# Patient Record
Sex: Male | Born: 1970 | Race: White | Hispanic: No | Marital: Married | Smoking: Never smoker
Health system: Southern US, Community
[De-identification: ages and names within clinical notes are randomized; demographics above are authoritative.]

## PROBLEM LIST (undated history)

## (undated) DIAGNOSIS — R569 Unspecified convulsions: Secondary | ICD-10-CM

## (undated) DIAGNOSIS — G510 Bell's palsy: Secondary | ICD-10-CM

## (undated) DIAGNOSIS — E782 Mixed hyperlipidemia: Secondary | ICD-10-CM

## (undated) HISTORY — DX: Mixed hyperlipidemia: E78.2

## (undated) HISTORY — PX: WRIST SURGERY: SHX841

---

## 2010-03-19 ENCOUNTER — Emergency Department (HOSPITAL_BASED_OUTPATIENT_CLINIC_OR_DEPARTMENT_OTHER): Admission: EM | Admit: 2010-03-19 | Discharge: 2010-03-19 | Payer: Self-pay | Admitting: Emergency Medicine

## 2010-03-19 ENCOUNTER — Ambulatory Visit: Payer: Self-pay | Admitting: Diagnostic Radiology

## 2012-05-16 ENCOUNTER — Ambulatory Visit (INDEPENDENT_AMBULATORY_CARE_PROVIDER_SITE_OTHER): Payer: BC Managed Care – PPO | Admitting: Family Medicine

## 2012-05-16 ENCOUNTER — Ambulatory Visit (HOSPITAL_BASED_OUTPATIENT_CLINIC_OR_DEPARTMENT_OTHER)
Admission: RE | Admit: 2012-05-16 | Discharge: 2012-05-16 | Disposition: A | Discharge status: Home / Self Care | Payer: BC Managed Care – PPO | Source: Ambulatory Visit | Attending: Family Medicine | Admitting: Family Medicine

## 2012-05-16 VITALS — BP 123/76 | Ht 77.0 in | Wt 245.0 lb

## 2012-05-16 DIAGNOSIS — M545 Low back pain, unspecified: Secondary | ICD-10-CM

## 2012-05-16 MED ORDER — PREDNISONE (PAK) 10 MG PO TABS
ORAL_TABLET | ORAL | Status: DC
Start: 2012-05-16 — End: 2015-10-06

## 2012-05-16 MED ORDER — MELOXICAM 15 MG PO TABS: 15.0000 mg | ORAL_TABLET | Freq: Every day | ORAL | Status: DC

## 2012-05-16 NOTE — Patient Instructions (Addendum)
You have a severe lumbar strain. Get x-rays downstairs before you leave today - we'll call you with the results. Prednisone dose pack x 6 days as directed. AFTER you finish prednisone start meloxicam 15mg  daily with food for pain and inflammation. Norco/vicodin as needed for severe pain (no driving on this medicine). Flexeril as needed for muscle spasms (no driving on this medicine if it makes you sleepy). Stay as active as possible. Do home exercises and stretches as directed - hold each for 20-30 seconds and do each one three times. Consider massage, chiropractor, physical therapy, and/or acupuncture. Physical therapy has been shown to be helpful while the others have mixed results. Strengthening of low back muscles, abdominal musculature are key for long term pain relief. If not improving, will consider further imaging (MRI). Follow up with me in 2 weeks if not better - if you want to do physical therapy after 2 weeks because you are improving just call me and we'll put this order in. Otherwise follow up in 1 month.

## 2012-05-19 ENCOUNTER — Encounter: Payer: Self-pay | Admitting: Family Medicine

## 2012-05-19 DIAGNOSIS — M545 Low back pain, unspecified: Secondary | ICD-10-CM | POA: Insufficient documentation

## 2012-05-19 NOTE — Assessment & Plan Note (Signed)
most likely due to lumbar strain, less likely disc herniation.  Radiographs negative for fracture or other bony abnormalities.  Prednisone dose pack then transition to meloxicam daily.  Norco as needed for severe pain (#60) q6h.  Shown home exercises and stretches.  F/u in 2 weeks if not better - would consider lumbar MRI at that time.  See instructions for further.

## 2012-05-19 NOTE — Progress Notes (Signed)
  Subjective:    Patient ID: Jay Cook, male    DOB: [birth year], 41 y.o.   MRN: [id number]  PCP: Dr. Corliss Blacker  HPI 41 yo M here for low back pain.  Patient states that yesterday while playing basketball he went to jump then prevented self from doing this. States this felt like it caused his upper half of body to go up then come down and compress hard. Had to stop playing as he developed fairly severe left sided low back pain as a result. No prior back problems. No leg symptoms. No numbness/tingling. No bowel/bladder dysfunction. Tried flexeril without much help.  History reviewed. No pertinent past medical history.  No current outpatient prescriptions on file prior to visit.    History reviewed. No pertinent past surgical history.  No Known Allergies  History   Social History  . Marital Status: Married    Spouse Name: N/A    Number of Children: N/A  . Years of Education: N/A   Occupational History  . Not on file.   Social History Main Topics  . Smoking status: Never Smoker   . Smokeless tobacco: Not on file  . Alcohol Use: Not on file  . Drug Use: Not on file  . Sexually Active: Not on file   Other Topics Concern  . Not on file   Social History Narrative  . No narrative on file    Family History  Problem Relation Age of Onset  . Hyperlipidemia Father   . Diabetes Neg Hx   . Heart attack Neg Hx   . Hypertension Neg Hx   . Sudden death Neg Hx     BP 123/76  Ht 6\' 5"  (1.956 m)  Wt 245 lb (111.131 kg)  BMI 29.05 kg/m2  Review of Systems See HPI above.    Objective:   Physical Exam Gen: NAD  Back: No gross deformity, scoliosis. TTP left lumbar paraspinal muscles.  No midline or bony TTP.  No right sided tenderness. FROM with pain on flexion > extension. Strength LEs 5/5 all muscle groups.   2+ MSRs in patellar and achilles tendons, equal bilaterally. Negative SLRs. Sensation intact to light touch bilaterally. Negative logroll bilateral hips    Assessment & Plan:  1. Low back pain - most likely due to lumbar strain, less likely disc herniation.  Radiographs negative for fracture or other bony abnormalities.  Prednisone dose pack then transition to meloxicam daily.  Norco as needed for severe pain (#60) q6h.  Shown home exercises and stretches.  F/u in 2 weeks if not better - would consider lumbar MRI at that time.  See instructions for further.

## 2014-06-16 ENCOUNTER — Ambulatory Visit (INDEPENDENT_AMBULATORY_CARE_PROVIDER_SITE_OTHER): Payer: BC Managed Care – PPO | Admitting: Sports Medicine

## 2014-06-16 ENCOUNTER — Encounter: Payer: Self-pay | Admitting: Sports Medicine

## 2014-06-16 VITALS — BP 113/61 | Ht 77.0 in | Wt 245.0 lb

## 2014-06-16 DIAGNOSIS — M7711 Lateral epicondylitis, right elbow: Secondary | ICD-10-CM | POA: Diagnosis not present

## 2014-06-16 MED ORDER — NITROGLYCERIN 0.2 MG/HR TD PT24: MEDICATED_PATCH | TRANSDERMAL | Status: DC

## 2014-06-16 NOTE — Patient Instructions (Signed)

## 2014-06-17 NOTE — Progress Notes (Signed)
   Subjective:    Patient ID: Jay Cook, male    DOB: 1970/09/01, 43 y.o.   MRN: [id number]  HPI chief complaint: Right elbow pain  Right-hand-dominant 43 year old male comes in today complaining of 4 months of right elbow pain. Pain began acutely while carrying a large plastic sleeve of water bottles. He describes a burning and aching discomfort along the lateral elbow which is present with grip and with twisting. He has tried a body helix compression sleeve with some improvement. Pain at times will radiate into his forearm and into the dorsum of his hand. No associated numbness or tingling. He has noticed some mild swelling as well. Denies similar problems in the past. No prior elbow surgeries. No neck pain. He's tried   intermittent doses of over-the-counter ibuprofen.  Past medical history reviewed Medications reviewed Allergies reviewed Social history reviewed    Review of Systems     Objective:   Physical Exam Well-developed, well-nourished. No acute distress. Awake alert and oriented 3. Vital signs reviewed  Right elbow: Full range of motion. No effusion. There is slight soft tissue swelling over the lateral epicondyle and tenderness to palpation here as well. Reproducible pain with resisted ECRB testing. No tenderness to palpation over the medial epicondyle. Negative Tinel's at the cubital tunnel. Decreased grip strength secondary to pain. Good radial and ulnar pulses.  MSK ultrasound of the right elbow: Limited images of the lateral elbow were obtained. There is a small hypoechoic area in the mid substance of the common extensor tendon. No obvious spur formation. No joint effusion. Findings are consistent with a possible small intrasubstance tear of the common extensor tendon.       Assessment & Plan:  Right elbow pain secondary to lateral epicondylitis  I discussed treatment options including cortisone injection versus a trial of topical nitroglycerin. We will start the  nitroglycerin protocol and I will couple this with eccentric exercises and stretching. Patient will continue with his body helix compression sleeve. He can alternatively try a counterforce brace. Follow-up with me in 4 weeks. If no improvement with the initial trial of nitroglycerin we will reconsider  merits of a cortisone injection. Patient will call with questions or concerns in the interim.

## 2014-07-19 ENCOUNTER — Ambulatory Visit (INDEPENDENT_AMBULATORY_CARE_PROVIDER_SITE_OTHER): Payer: BLUE CROSS/BLUE SHIELD | Admitting: Sports Medicine

## 2014-07-19 ENCOUNTER — Encounter: Payer: Self-pay | Admitting: Sports Medicine

## 2014-07-19 VITALS — BP 124/77 | HR 85 | Ht 77.0 in | Wt 255.0 lb

## 2014-07-19 DIAGNOSIS — M7711 Lateral epicondylitis, right elbow: Secondary | ICD-10-CM | POA: Diagnosis not present

## 2014-07-19 NOTE — Progress Notes (Signed)
   Subjective:    Patient ID: Jay Cook, male    DOB: 1970/09/29, 44 y.o.   MRN: [id number]  HPI  Patient comes in today for follow-up on right elbow lateral epicondylitis. He is feeling much better. States that he is about 70-75% better. He is using his nitroglycerin patches and doing his home exercises. He has no restrictions on activity.    Review of Systems     Objective:   Physical Exam Well-developed, well-nourished. No acute distress.  Right elbow: Full range of motion. No effusion. Previous soft tissue swelling has resolved. Minimal tenderness to palpation over the lateral epicondyle. Minimal pain with resisted ECRB testing. Good grip strength. Neurovascular intact distally.  MSK ultrasound of the right elbow was performed. A long image of the common extensor tendon at the insertion onto the lateral epicondyle was performed. Slight hypoechoic area deep to the tendon but the majority of the tendon appears normal with no signs of tearing.       Assessment & Plan:  Improving right elbow pain secondary to lateral epicondylitis  I think the patient should continue on his nitroglycerin patch for an additional 4 weeks. Continue eccentric exercises as well. As long as he continues to improve he can follow-up as needed.

## 2014-12-02 ENCOUNTER — Other Ambulatory Visit: Payer: Self-pay | Admitting: *Deleted

## 2014-12-02 MED ORDER — NITROGLYCERIN 0.2 MG/HR TD PT24
MEDICATED_PATCH | TRANSDERMAL | Status: DC
Start: 2014-12-02 — End: 2017-06-06

## 2015-04-06 ENCOUNTER — Other Ambulatory Visit: Payer: Self-pay | Admitting: *Deleted

## 2015-04-06 MED ORDER — MELOXICAM 15 MG PO TABS: ORAL_TABLET | ORAL | Status: DC

## 2015-08-01 ENCOUNTER — Other Ambulatory Visit: Payer: Self-pay | Admitting: *Deleted

## 2015-08-01 MED ORDER — MELOXICAM 15 MG PO TABS: ORAL_TABLET | ORAL | Status: DC

## 2015-10-06 ENCOUNTER — Encounter: Payer: Self-pay | Admitting: *Deleted

## 2015-10-06 ENCOUNTER — Encounter: Payer: Self-pay | Admitting: Family Medicine

## 2015-10-06 ENCOUNTER — Ambulatory Visit (INDEPENDENT_AMBULATORY_CARE_PROVIDER_SITE_OTHER): Payer: 59 | Admitting: Family Medicine

## 2015-10-06 VITALS — Ht 77.0 in | Wt 257.2 lb

## 2015-10-06 DIAGNOSIS — R635 Abnormal weight gain: Secondary | ICD-10-CM

## 2015-10-06 DIAGNOSIS — E669 Obesity, unspecified: Secondary | ICD-10-CM | POA: Diagnosis not present

## 2015-10-06 NOTE — Progress Notes (Signed)
Patient ID: Gad Aymond, male   DOB: May 25, 1971, 45 y.o.   MRN: [id number] Pre-determination form faxed today to [employer] [phone]) to request coverage for either 97802/3 (MNT) or 99401/2/3/4 (nutr counseling).  Sent in progress note from 10-06-15 and current height, weight, and BMI.

## 2015-10-06 NOTE — Progress Notes (Signed)
Medical Nutrition Therapy:  Appt start time: 1100 end time:  1200.  Assessment:  Primary concerns today: Weight management.   Learning Readiness: Ready  Jay Cook would like to better manage his weight.  He was referred by Dr. Micheline Chapman, whom he has seen for previous sports-related injuries.  Weight loss will not only reduce his injury risk, but will benefit his long-term health.  He has been working out consistently for the past year, and tries to make good food choices, but is not sure of the best way to eat for both health and weight loss.  Current BMI is 30.49; weight has increased ~12 lb since 2015.    Usual eating pattern includes 3 meals and 1 snack per day. Frequent foods and beverages include [employer] drink (3-4 X wk), eggs/egg whites, bagel, and [place] bacon for breakfast; deli salad for lunch most days, cereal (bedtime snack) with 1% or skim milk, 150 oz water/day.  Avoided foods include tomatoes, avocados, mushrooms, cucumbers.   Usual physical activity includes 60-90 min wt training 5-7 X wk, 30-60 min cardio (TM or elliptical) 2 X wk.   Also plays softball 2 X wk.  Bright said he sometimes gets light-headed during workouts, which he usually does after work.  Food intake tends to be heavier in evenings, and light-headedness during workouts suggests he would benefit by an afternoon snack prior to working out.  Has 2-YO son and 5-YO daughter with whom he lives, along with his wife.  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   (Eula's wife recently did the Next 34 Days program for wt mgmt.)  24-hr recall suggests intake of ~2790 kcal: (Up at 5:53 AM) B (6:15 AM)-   3 eggs (minus 1 yolk), 2 pcs [place] bacon, 1 bl'berry bagel, water 620 Snk ( AM)-   1 Builder Bar (20 g protein, 8 g sugar)    270 L (12 PM)-  Fajita nachos (chs, chx), water     500 Snk ( PM)-  --- D (6:45  PM)-  3 1/2 c chx, rice, broccoli, chs casserole, water   800(+) Snk (9:30)-  3 c Str'ber Sp K, 1 T xylitol, 2 c 1%  milk    500 Typical day? Yes.    Progress Towards Goal(s):  In progress.   Nutritional Diagnosis:  NI-1.5 Excessive energy intake As related to meal composition.  As evidenced by inadequate proportion of vegetables at meals (and BMI >30).    Intervention:  Nutrition education.  Handouts given during visit include:  AVS  Demonstrated degree of understanding via:  Teach Back  Barriers to learning/adherence to lifestyle change: Food has typically not been much of an interest/priority for Que; this will need to change in order to make meaningful changes in his intake.    Monitoring/Evaluation:  Dietary intake, exercise, and body weight prn; insurance coverage will influence his ability to follow up with subsequent appt(s).

## 2015-10-06 NOTE — Patient Instructions (Addendum)
-   Wt Loss:  Let's think in terms of 5-lb increments.    Recommendations: - Whole, real foods are best.  (If you cannot make this in your kitchen, it's highly processed.) - Eat at least 3 REAL meals and 1-2 snacks per day.  Aim for no more than 5 hours between eating.  Eat breakfast within one hour of getting up.   - Snacks:  Fruit, nuts, seeds, trail mix, yogurt, string cheese, cereal (look for at least 5 grams fiber per serving) with milk.    - Advance planning will be key to making this work.   - Workouts:  Be sure to have some fuel pre-workout as well as within 30 min post-ex.    - Include BOTH carb's and protein.    - Whey is the gold standard, but you may get the best absorption is possible with any kind of protein that is hydrolyzed.   - Obtain twice as many veg's as protein or carbohydrate foods for both lunch and dinner. - Pay attn to how you feel in your workouts and thru the day.  If you feel fatigued, that is a sign you're not getting adequate energy/carb's.    - Pro-stat sports drink:  1 oz (1 pkt) Pro-stat with 10 oz WF 365 lemonade; dilute to 20-24 oz with water.

## 2015-10-06 NOTE — Progress Notes (Signed)
Patient ID: Jay Cook, male   DOB: 26-Jul-1970, 45 y.o.   MRN: [id number] Pt called to discuss wanting to loss wt and to lower his BMI as a preventive care measure. Told pt we could refer him to Dr Jenne Campus at the [facility] and he could discuss nutritional counseling to manage his increased weight gain and preventive care options due to with her per Dr. Micheline Chapman

## 2015-10-28 ENCOUNTER — Ambulatory Visit (INDEPENDENT_AMBULATORY_CARE_PROVIDER_SITE_OTHER): Payer: 59 | Admitting: Family Medicine

## 2015-10-28 ENCOUNTER — Encounter: Payer: Self-pay | Admitting: Family Medicine

## 2015-10-28 VITALS — BP 128/78 | Ht 72.0 in | Wt 253.0 lb

## 2015-10-28 DIAGNOSIS — S76312A Strain of muscle, fascia and tendon of the posterior muscle group at thigh level, left thigh, initial encounter: Secondary | ICD-10-CM

## 2015-10-28 MED ORDER — TRAMADOL HCL 50 MG PO TABS: ORAL_TABLET | ORAL | Status: DC

## 2015-10-28 NOTE — Progress Notes (Signed)
Patient ID: Jay Cook, male   DOB: 1970-08-21, 45 y.o.   MRN: [id number]  Jay Cook - 45 y.o. male MRN [id number]  Date of birth: 1970-10-27    SUBJECTIVE:     Chief Complaint: Left posterior thigh pain, acute Date of injury 10/27/2015  HPI:Was running to first base when he felt a pop in the posterior part of his thigh. Had immediate onset of pain. This occurred last night.. Since then he has pain every time he tries to bear weight. Has had some swelling and bruising. Some pain at rest. ROS:     See history of present illness above. Additional pertinent review of systems is negative for fever, sweats, chills, unusual weight change. He's had no other arthralgias or myalgias. No calf pain. No ankle swelling. No knee stiffness or knee pain.  PERTINENT  PMH / PSH FH / / SH:  Past Medical, Surgical, Social, and Family History Reviewed & Updated in the EMR.  Pertinent findings include:  No personal history diabetes mellitus Nonsmoker No prior history of thigh injury  OBJECTIVE: BP 128/78 mmHg  Ht 6' (1.829 m)  Wt 253 lb (114.76 kg)  BMI 34.31 kg/m2  Physical Exam:  Vital signs are reviewed. GEN.: Well-developed male no acute distress THIGH/hamstring: Left. Small amount of ecchymoses about 8 inches from the she'll tuberosity and this is the area where he is tender to palpation. There is no defect noted. Hamstring strength is normal in my estimation although he is limited somewhat by pain. GAIT: Antalgic KNEES: No effusion. Full range of motion. Ligaments intact. HIPS: Internal/external rotation are full and painless. Flexion bilaterally 5 out of 5 symmetrical and painless.   ULTRASOUND: The origin of the hamstrings is intact without any sign of defect, no surrounding fluid. The biceps for Morris portion of the left hamstring is tender to palpation about 10 cm from the origin. This is where the bruising is located. Ultrasound here reveals some disruption of the muscle fibers. There is  increased Doppler activity noted here as well. The left semi-tendinosis has some very small amount of muscle fiber disruption but no increased Doppler activity.  ASSESSMENT & PLAN:  See problem based charting & AVS for pt instructions.

## 2015-10-28 NOTE — Assessment & Plan Note (Signed)
Compression, ice at least twice a day. Weightbearing as tolerated. No stretching or lower extremity exercise other than that used for ADLs. We will do this for a week and then see him back. He's doing well we can start him in phase I of rehabilitation. He is having quite a bit of pain so give him some tramadol. He works in [employer] and alternate sitting and standing, says he does not need a note for work.  Also cautioned him on signs of DVT since he has had a lower extremity injury. I would have him avoid NSAIDs for the next week and use the tramadol for pain.

## 2015-10-28 NOTE — Patient Instructions (Signed)
You have a small tear in your hamstring. Wear the compression sleeve. ICE twice daily for 15-20 minutes. Do not do any strenuous activity with the legs. See [place] next week. Please call us if there are any questions or  Problems.

## 2015-11-04 ENCOUNTER — Ambulatory Visit: Payer: 59 | Admitting: Family Medicine

## 2016-02-02 ENCOUNTER — Other Ambulatory Visit: Payer: Self-pay | Admitting: Otolaryngology

## 2016-02-02 DIAGNOSIS — J310 Chronic rhinitis: Secondary | ICD-10-CM

## 2016-02-02 DIAGNOSIS — J342 Deviated nasal septum: Secondary | ICD-10-CM

## 2016-02-20 ENCOUNTER — Other Ambulatory Visit: Payer: 59

## 2016-02-28 ENCOUNTER — Other Ambulatory Visit: Payer: 59

## 2016-04-23 ENCOUNTER — Encounter: Payer: Self-pay | Admitting: Sports Medicine

## 2016-04-23 ENCOUNTER — Ambulatory Visit (INDEPENDENT_AMBULATORY_CARE_PROVIDER_SITE_OTHER): Payer: 59 | Admitting: Sports Medicine

## 2016-04-23 VITALS — BP 144/81 | Ht 77.0 in | Wt 250.0 lb

## 2016-04-23 DIAGNOSIS — S39011A Strain of muscle, fascia and tendon of abdomen, initial encounter: Secondary | ICD-10-CM | POA: Diagnosis not present

## 2016-04-23 MED ORDER — DICLOFENAC SODIUM 75 MG PO TBEC: DELAYED_RELEASE_TABLET | ORAL | 1 refills | Status: DC

## 2016-04-23 NOTE — Progress Notes (Signed)
   Subjective:    Patient ID: Jay Cook, male    DOB: [birth year], 45 y.o.   MRN: [id number]  HPI chief complaint: Left groin pain  Jay Cook comes in today complaining of one day of left groin pain. He first noticed pain last night when rolling over in bed. He had a recurrence of pain earlier today. He describes it as sharp in quality. It is hard to reproduce. He denies similar issues in the past. He is worried about a possible hernia, specifically a sports hernia.  Interim medical history reviewed Medications reviewed Allergies reviewed    Review of Systems As above    Objective:   Physical Exam  Well-developed, well-nourished. No acute distress.  Left abdomen: There is no tenderness to palpation along the left lower abdomen at the insertion of the abdominal muscles onto the pubic bone. No palpable defect to suggest hernia. There is no reproducible pain with resisted sitting up. No pain with resisted adduction. His hip demonstrates smooth painless hip range of motion with a negative logroll. Normal testicle.      Assessment & Plan:   Left lower abdomen/groin pain likely secondary to simple muscle strain  Reassurance. Voltaren 75 mg twice daily for 5 days then when necessary. Resume activity as tolerated. If symptoms persist or worsen consider imaging likely in the form of an MRI. Follow-up as needed.

## 2016-06-07 ENCOUNTER — Other Ambulatory Visit: Payer: Self-pay | Admitting: Otolaryngology

## 2016-06-07 DIAGNOSIS — J342 Deviated nasal septum: Secondary | ICD-10-CM

## 2016-06-07 DIAGNOSIS — J329 Chronic sinusitis, unspecified: Secondary | ICD-10-CM

## 2016-06-08 ENCOUNTER — Ambulatory Visit
Admission: RE | Admit: 2016-06-08 | Discharge: 2016-06-08 | Disposition: A | Discharge status: Home / Self Care | Payer: 59 | Source: Ambulatory Visit | Attending: Otolaryngology | Admitting: Otolaryngology

## 2016-06-08 DIAGNOSIS — J329 Chronic sinusitis, unspecified: Secondary | ICD-10-CM

## 2016-06-08 DIAGNOSIS — J342 Deviated nasal septum: Secondary | ICD-10-CM

## 2016-09-13 DIAGNOSIS — J342 Deviated nasal septum: Secondary | ICD-10-CM | POA: Diagnosis not present

## 2016-09-13 DIAGNOSIS — J343 Hypertrophy of nasal turbinates: Secondary | ICD-10-CM | POA: Diagnosis not present

## 2016-09-27 DIAGNOSIS — J343 Hypertrophy of nasal turbinates: Secondary | ICD-10-CM | POA: Diagnosis not present

## 2016-09-27 DIAGNOSIS — J342 Deviated nasal septum: Secondary | ICD-10-CM | POA: Diagnosis not present

## 2016-10-18 DIAGNOSIS — H6591 Unspecified nonsuppurative otitis media, right ear: Secondary | ICD-10-CM | POA: Diagnosis not present

## 2016-10-18 DIAGNOSIS — H9201 Otalgia, right ear: Secondary | ICD-10-CM | POA: Diagnosis not present

## 2016-11-14 ENCOUNTER — Encounter: Payer: Self-pay | Admitting: Family Medicine

## 2016-11-14 ENCOUNTER — Other Ambulatory Visit: Payer: Self-pay | Admitting: *Deleted

## 2016-11-14 ENCOUNTER — Ambulatory Visit
Admission: RE | Admit: 2016-11-14 | Discharge: 2016-11-14 | Disposition: A | Discharge status: Home / Self Care | Payer: 59 | Source: Ambulatory Visit | Attending: Sports Medicine | Admitting: Sports Medicine

## 2016-11-14 ENCOUNTER — Ambulatory Visit (INDEPENDENT_AMBULATORY_CARE_PROVIDER_SITE_OTHER): Payer: 59 | Admitting: Family Medicine

## 2016-11-14 VITALS — BP 120/67 | Ht 77.0 in | Wt 260.0 lb

## 2016-11-14 DIAGNOSIS — M25571 Pain in right ankle and joints of right foot: Secondary | ICD-10-CM | POA: Diagnosis not present

## 2016-11-14 DIAGNOSIS — S86311A Strain of muscle(s) and tendon(s) of peroneal muscle group at lower leg level, right leg, initial encounter: Secondary | ICD-10-CM | POA: Diagnosis not present

## 2016-11-14 DIAGNOSIS — S93402A Sprain of unspecified ligament of left ankle, initial encounter: Secondary | ICD-10-CM | POA: Diagnosis not present

## 2016-11-14 MED ORDER — MELOXICAM 15 MG PO TABS: 15.0000 mg | ORAL_TABLET | Freq: Every day | ORAL | 1 refills | Status: DC

## 2016-11-14 NOTE — Progress Notes (Signed)
  Jay Cook - 46 y.o. male MRN [id number]  Date of birth: [birth year]  SUBJECTIVE:  Including CC & ROS.   Jay Cook is a 45 year old male that is presenting with right ankle pain. He is playing softball last night and was reaching to tag someone out and felt a pop in his ankle. He has had pain since that time swelling. He has tried some ice and compression. He denies any prior injury to his ankle. He denies any changes in sensation. He is having pain that is just distal to the lateral malleolus as well as some pain in the lateral aspect of his lower leg.  ROS: No unexpected weight loss, fever, chills, swelling, instability, numbness/tingling, redness, otherwise see HPI   HISTORY: Past Medical, Surgical, Social, and Family History Reviewed & Updated per EMR.   Pertinent Historical Findings include: Surgical:   Appendectomy Social:  No tobacco use  DATA REVIEWED: 11/14/16: Right ankle x-ray: Normal  PHYSICAL EXAM:  VS: BP 120/67   Ht 6' 5"  (1.956 m)   Wt 260 lb (117.9 kg)   BMI 30.83 kg/m  PHYSICAL EXAM: Gen: NAD, alert, cooperative with exam, well-appearing HEENT: clear conjunctiva, EOMI CV:  no edema, capillary refill brisk,  Resp: non-labored, normal speech Skin: no rashes, normal turgor  Neuro: no gross deficits.  Psych:  alert and oriented MSK: Right ankle: Some swelling on the lateral aspect of the ankle. Some tenderness to palpation over the peroneal muscles Normal plantar and dorsiflexion Good strength to resistance in all directions. Walking with a slight limp. Normal anterior drawer. Normal talar tilt. Thompson test with plantarflexion. Neurovascularly intact.  Limited ultrasound: Right leg/ankle:  Peroneal longus and peroneal brevis are still intact There appears to be fluid around the peroneals as a pass around the lateral malleolus. 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 He may have strained the tendon but also may have ruptured the retinaculum as there was some fluid surrounding the tendons around the lateral malleolus. There also seemed to be more subluxation with dynamic testing the peroneals around the lateral malleolus. - Body helix compression - Encouraged ice - Mobic for pain - Follow-up in 2 weeks to rescan - Can provide some home exercises at follow-up.

## 2016-11-15 DIAGNOSIS — S86311A Strain of muscle(s) and tendon(s) of peroneal muscle group at lower leg level, right leg, initial encounter: Secondary | ICD-10-CM | POA: Insufficient documentation

## 2016-11-15 NOTE — Assessment & Plan Note (Signed)
Does not appear that the tendons are ruptured as they were followed distally on ultrasound. He may have strained the tendon but also may have ruptured the retinaculum as there was some fluid surrounding the tendons around the lateral malleolus. There also seemed to be more subluxation with dynamic testing the peroneals around the lateral malleolus. - Body helix compression - Encouraged ice - Mobic for pain - Follow-up in 2 weeks to rescan - Can provide some home exercises at follow-up.

## 2017-04-01 DIAGNOSIS — J00 Acute nasopharyngitis [common cold]: Secondary | ICD-10-CM | POA: Diagnosis not present

## 2017-04-01 DIAGNOSIS — R5383 Other fatigue: Secondary | ICD-10-CM | POA: Diagnosis not present

## 2017-04-01 DIAGNOSIS — J069 Acute upper respiratory infection, unspecified: Secondary | ICD-10-CM | POA: Diagnosis not present

## 2017-04-01 DIAGNOSIS — R05 Cough: Secondary | ICD-10-CM | POA: Diagnosis not present

## 2017-04-19 DIAGNOSIS — E291 Testicular hypofunction: Secondary | ICD-10-CM | POA: Diagnosis not present

## 2017-04-20 DIAGNOSIS — Z23 Encounter for immunization: Secondary | ICD-10-CM | POA: Diagnosis not present

## 2017-06-06 ENCOUNTER — Ambulatory Visit: Payer: 59 | Admitting: Sports Medicine

## 2017-06-06 ENCOUNTER — Encounter: Payer: Self-pay | Admitting: Sports Medicine

## 2017-06-06 DIAGNOSIS — E291 Testicular hypofunction: Secondary | ICD-10-CM

## 2017-06-06 DIAGNOSIS — Z0000 Encounter for general adult medical examination without abnormal findings: Secondary | ICD-10-CM

## 2017-06-06 NOTE — Assessment & Plan Note (Addendum)
Doing testosterone injections, prescribed by urology. Labs were also checked by urology, we will remain hands-off. He has been doing the shots now for a couple of weeks and is not feeling any improvement, if testosterone levels are normal at the recheck we will need to look into sleep apnea as a cause for fatigue.

## 2017-06-06 NOTE — Assessment & Plan Note (Signed)
Up-to-date on tetanus and influenza vaccines. Physical normal. Checking routine labs.

## 2017-06-06 NOTE — Progress Notes (Signed)
  Subjective:    CC: Establish care.   HPI:  Denim is a pleasant 46 year old male, he has no complaints, here to establish care and get caught up on preventive measures.  He does do testosterone supplementation, this is managed by his urologist.  Past medical history:  Negative.  See flowsheet/record as well for more information.  Surgical history: Negative.  See flowsheet/record as well for more information.  Family history: Negative.  See flowsheet/record as well for more information.  Social history: Negative.  See flowsheet/record as well for more information.  Allergies, and medications have been entered into the medical record, reviewed, and no changes needed.    (To billers/coders, pertinent past medical, social, surgical, family history can be found in problem list, if problem list is marked as reviewed then this indicates that past medical, social, surgical, family history was also reviewed)  Review of Systems: No headache, visual changes, nausea, vomiting, diarrhea, constipation, dizziness, abdominal pain, skin rash, fevers, chills, night sweats, swollen lymph nodes, weight loss, chest pain, body aches, joint swelling, muscle aches, shortness of breath, mood changes, visual or auditory hallucinations.  Objective:    General: Well Developed, well nourished, and in no acute distress.  Neuro: Alert and oriented x3, extra-ocular muscles intact, sensation grossly intact. Cranial nerves II through XII are intact, motor, sensory, and coordinative functions are all intact. HEENT: Normocephalic, atraumatic, pupils equal round reactive to light, neck supple, no masses, no lymphadenopathy, thyroid nonpalpable. Oropharynx, nasopharynx, external ear canals are unremarkable. Skin: Warm and dry, no rashes noted.  Cardiac: Regular rate and rhythm, no murmurs rubs or gallops.  Respiratory: Clear to auscultation bilaterally. Not using accessory muscles, speaking in full sentences.  Abdominal: Soft,  nontender, nondistended, positive bowel sounds, no masses, no organomegaly.  Musculoskeletal: Shoulder, elbow, wrist, hip, knee, ankle stable, and with full range of motion.  Impression and Recommendations:    The patient was counselled, risk factors were discussed, anticipatory guidance given.  Annual physical exam Up-to-date on tetanus and influenza vaccines. Physical normal. Checking routine labs.  Male hypogonadism Doing testosterone injections, prescribed by urology. Labs were also checked by urology, we will remain hands-off. He has been doing the shots now for a couple of weeks and is not feeling any improvement, if testosterone levels are normal at the recheck we will need to look into sleep apnea as a cause for fatigue. ___________________________________________ Gwen Her. Dianah Field, M.D., ABFM., CAQSM. Primary Care and [place] In[employer] [place] [employer] [facility]

## 2017-06-14 DIAGNOSIS — L718 Other rosacea: Secondary | ICD-10-CM | POA: Diagnosis not present

## 2017-06-14 DIAGNOSIS — L853 Xerosis cutis: Secondary | ICD-10-CM | POA: Diagnosis not present

## 2017-06-17 DIAGNOSIS — E291 Testicular hypofunction: Secondary | ICD-10-CM | POA: Diagnosis not present

## 2017-06-27 DIAGNOSIS — Z0000 Encounter for general adult medical examination without abnormal findings: Secondary | ICD-10-CM | POA: Diagnosis not present

## 2017-06-28 ENCOUNTER — Encounter: Payer: Self-pay | Admitting: Physician Assistant

## 2017-06-28 ENCOUNTER — Other Ambulatory Visit: Payer: Self-pay

## 2017-06-28 DIAGNOSIS — E782 Mixed hyperlipidemia: Secondary | ICD-10-CM

## 2017-06-28 DIAGNOSIS — R7989 Other specified abnormal findings of blood chemistry: Secondary | ICD-10-CM

## 2017-06-28 DIAGNOSIS — R748 Abnormal levels of other serum enzymes: Secondary | ICD-10-CM

## 2017-06-28 HISTORY — DX: Mixed hyperlipidemia: E78.2

## 2017-06-28 LAB — CBC
HCT: 44.8 % (ref 38.5–50.0)
Hemoglobin: 15 g/dL (ref 13.2–17.1)
MCH: 27.9 pg (ref 27.0–33.0)
MCHC: 33.5 g/dL (ref 32.0–36.0)
MCV: 83.3 fL (ref 80.0–100.0)
MPV: 12.6 fL — ABNORMAL HIGH (ref 7.5–12.5)
Platelets: 189 Thousand/uL (ref 140–400)
RBC: 5.38 10*6/uL (ref 4.20–5.80)
RDW: 14.5 % (ref 11.0–15.0)
WBC: 4.7 Thousand/uL (ref 3.8–10.8)

## 2017-06-28 LAB — HIV ANTIBODY (ROUTINE TESTING W REFLEX): HIV 1&2 Ab, 4th Generation: NONREACTIVE

## 2017-06-28 LAB — LIPID PANEL W/REFLEX DIRECT LDL
Cholesterol: 215 mg/dL — ABNORMAL HIGH (ref <200)
HDL: 46 mg/dL (ref >40)
LDL Cholesterol (Calc): 145 mg/dL — ABNORMAL HIGH
Non-HDL Cholesterol (Calc): 169 mg/dL (calc) — ABNORMAL HIGH (ref <130)
Total CHOL/HDL Ratio: 4.7 (calc) (ref <5.0)
Triglycerides: 120 mg/dL (ref <150)

## 2017-06-28 LAB — COMPREHENSIVE METABOLIC PANEL
AG Ratio: 1.9 (calc) (ref 1.0–2.5)
AST: 35 U/L (ref 10–40)
Alkaline phosphatase (APISO): 41 U/L (ref 40–115)
CO2: 29 mmol/L (ref 20–32)
Chloride: 105 mmol/L (ref 98–110)
Globulin: 2.4 g/dL (calc) (ref 1.9–3.7)

## 2017-06-28 LAB — COMPREHENSIVE METABOLIC PANEL WITH GFR
ALT: 24 U/L (ref 9–46)
Albumin: 4.5 g/dL (ref 3.6–5.1)
BUN/Creatinine Ratio: 10 (calc) (ref 6–22)
BUN: 14 mg/dL (ref 7–25)
Calcium: 9.5 mg/dL (ref 8.6–10.3)
Creat: 1.46 mg/dL — ABNORMAL HIGH (ref 0.60–1.35)
Glucose, Bld: 112 mg/dL — ABNORMAL HIGH (ref 65–99)
Potassium: 4.7 mmol/L (ref 3.5–5.3)
Sodium: 139 mmol/L (ref 135–146)
Total Bilirubin: 0.6 mg/dL (ref 0.2–1.2)
Total Protein: 6.9 g/dL (ref 6.1–8.1)

## 2017-06-28 LAB — HEMOGLOBIN A1C
Hgb A1c MFr Bld: 5.5 % of total Hgb (ref <5.7)
Mean Plasma Glucose: 111 (calc)
eAG (mmol/L): 6.2 (calc)

## 2017-06-28 LAB — VITAMIN D 25 HYDROXY (VIT D DEFICIENCY, FRACTURES): Vit D, 25-Hydroxy: 26 ng/mL — ABNORMAL LOW (ref 30–100)

## 2017-06-28 LAB — TSH: TSH: 1.96 mIU/L (ref 0.40–4.50)

## 2017-06-28 NOTE — Progress Notes (Signed)
-   cholesterol is elevated. Work on decreasing saturated fat/processed food in diet, increasing aerobic exercise and weight loss - kidney function was a little reduced. Recommend he hydrate, avoid OTC pain relievers, and we recheck this in 1 week - vitamin D is a little low. Recommend daily vitamin D3 1000 unit supplement  Otherwise - normal blood counts - normal electrolytes and liver enzymes - normal thyroid function

## 2017-08-20 ENCOUNTER — Ambulatory Visit: Payer: 59 | Admitting: Sports Medicine

## 2017-08-20 DIAGNOSIS — B360 Pityriasis versicolor: Secondary | ICD-10-CM | POA: Diagnosis not present

## 2017-08-20 DIAGNOSIS — B029 Zoster without complications: Secondary | ICD-10-CM | POA: Diagnosis not present

## 2017-08-20 DIAGNOSIS — L718 Other rosacea: Secondary | ICD-10-CM | POA: Diagnosis not present

## 2017-09-09 DIAGNOSIS — J029 Acute pharyngitis, unspecified: Secondary | ICD-10-CM | POA: Diagnosis not present

## 2017-09-10 DIAGNOSIS — K120 Recurrent oral aphthae: Secondary | ICD-10-CM | POA: Diagnosis not present

## 2018-03-07 ENCOUNTER — Encounter: Payer: Self-pay | Admitting: Sports Medicine

## 2018-03-07 ENCOUNTER — Ambulatory Visit (INDEPENDENT_AMBULATORY_CARE_PROVIDER_SITE_OTHER): Payer: 59 | Admitting: Sports Medicine

## 2018-03-07 DIAGNOSIS — M25412 Effusion, left shoulder: Secondary | ICD-10-CM | POA: Insufficient documentation

## 2018-03-07 DIAGNOSIS — M25512 Pain in left shoulder: Secondary | ICD-10-CM

## 2018-03-07 MED ORDER — SULFAMETHOXAZOLE-TRIMETHOPRIM 800-160 MG PO TABS: 1.0000 | ORAL_TABLET | Freq: Two times a day (BID) | ORAL | 0 refills | Status: AC

## 2018-03-07 MED ORDER — TRAMADOL HCL 50 MG PO TABS: 50.0000 mg | ORAL_TABLET | Freq: Four times a day (QID) | ORAL | 0 refills | Status: DC | PRN

## 2018-03-07 MED FILL — SULFAMETHOXAZOLE-TMP DS TAB: 800-160 | 10 days supply | Qty: 20 | Fill #0

## 2018-03-07 MED FILL — traMADol HCL 50 MG TABS: 50 | 3 days supply | Qty: 10 | Fill #0

## 2018-03-07 NOTE — Assessment & Plan Note (Signed)
Acute onset x1 week, patient reports he self-injects testosterone into deltoid region.  Given ultrasound finding of fluid collection and history of fever, concern this may be 2/2 subcutaneous abscess requiring drainage by Orthopedics.  He is afebrile at visit today, however with significant warmth, swelling and tenderness over anterior deltoid region.  Do not suspect any involvement of the shoulder joint as his exam is otherwise within normal.   Have discussed with Dr. Griffin Basil at [facility].  Will trial po antibiotics at this time and schedule follow up at [facility] on Tuesday.  Attempted to drain under ultrasound guidance at this visit, however unable to extract any fluid from the collection so cx not sent.  -Rx: Bactrim DS BID x10 d  -may take Tylenol prn for fever; Rx: Tramadol 50 mg given for severe pain -Advised pt if he has any recurrent fevers on antibiotics or worsening of pain and swelling between now and his appointment, he should be seen in the ED. He expressed good understanding of this.

## 2018-03-07 NOTE — Progress Notes (Addendum)
Subjective:   Patient ID: Jay Cook    DOB: 28-Dec-1970, 47 y.o. male   MRN: [id number]  CC: left shoulder pain   HPI: Reginald Cook is a 47 y.o. male who presents to clinic today for shoulder pain x1 week.  He has a history of low testosterone for which he follows with urology and self-injects 0.5 cc testosterone into the deltoid region twice a week.  He reports he alternates shoulders each time and has been doing this for about 5 years without any adverse drug reaction.   His last injection was Saturday into the left deltoid.  On Tuesday he noticed worsening pain in the region and had a low-grade temp to 100F.  Fevers have not recurred since.  He lifts heavy weights, however had not lifted since Tuesday the week prior to injection.  No history of fall or trauma. Did not feel a popping sensation at any time while exercising previously.  He has found it difficult to find a comfortable position to sleep in and the pain often times keeps him awake at night.  Pain worse with driving and applying deodorant as well as dressing himself and other overhead activity.  He reports pain is 9/10 at worst, constant in nature and does not radiate down the arm or into his neck.  No numbness or tingling in hand or fingers.   He has noticed visible swelling over the shoulder joint compared to his right side.  He has tried Advil liquid gels without any significant relief.  Has also tried applying ice and heating pads which have not helped.   ROS: +fever and joint swelling.  No weakness, numbness, tingling.      [place]: Pertinent past medical, surgical, family, and social history were reviewed and updated as appropriate. Smoking status reviewed. Medications reviewed.  Objective:   BP 139/86   Ht 6' 6"  (1.981 m)   Wt 275 lb (124.7 kg)   BMI 31.78 kg/m  Temp: 98.66F  Vitals and nursing note reviewed.  General: well nourished, well developed, in no acute distress with non-toxic appearance   Left  Shoulder: Inspection reveals visible swelling over left anterior deltoid region.  Markedly tender to palpation over deltoid, no tenderness over AC joint or bicipital groove. ROM with limited abduction to 90 degrees due to pain and swelling.    Rotator cuff strength 5/5 throughout.  No signs of impingement with negative Neer and Hawkin's tests, empty can test. Speeds and Yergason's tests normal. No labral pathology noted with negative Obrien's.   Normal scapular function observed. No painful arc and no drop arm sign. No apprehension sign.   Neuro: alert, oriented x3, no focal deficits. Normal sensation, neurovascularly intact distally.    Ultrasound: Revealed 2.5-3 cm x1.5 cm subcutaneous collection of fluid, suspect this may be a loculated abscess given history. Surrounding soft tissue is very hyperechoic suggestive of concomitant cellulitis.   Assessment & Plan:   Pain and swelling of left shoulder Acute onset x1 week, patient reports he self-injects testosterone into deltoid region.  Given ultrasound finding of fluid collection and history of fever, concern this may be 2/2 subcutaneous abscess requiring drainage by Orthopedics.  He is afebrile at visit today, however with significant warmth, swelling and tenderness over anterior deltoid region.  Do not suspect any involvement of the shoulder joint as his exam is otherwise within normal.   Have discussed with Dr. Griffin Basil at [facility].  Will trial po antibiotics at this time and schedule follow up  at [facility] on Tuesday.  Attempted to drain under ultrasound guidance at this visit, however unable to extract any fluid from the collection so cx not sent.  -Rx: Bactrim DS BID x10 d  -may take Tylenol prn for fever; Rx: Tramadol 50 mg given for severe pain -Advised pt if he has any recurrent fevers on antibiotics or worsening of pain and swelling between now and his appointment, he should be seen in the ED. He expressed good understanding of this.      Meds ordered this encounter  Medications  . sulfamethoxazole-trimethoprim (BACTRIM DS) 800-160 MG tablet    Sig: Take 1 tablet by mouth 2 (two) times daily for 10 days.    Dispense:  20 tablet    Refill:  0  . traMADol (ULTRAM) 50 MG tablet    Sig: Take 1 tablet (50 mg total) by mouth every 6 (six) hours as needed for moderate pain or severe pain.    Dispense:  10 tablet    Refill:  0   Lovenia Kim, MD [place] PGY-3 03/07/2018 11:31 AM   Patient seen and evaluated with the resident. I agree with the above plan of care. Patient's physical exam and ultrasound findings are concerning for cellulitis with an underlying abscess. Unfortunately, aspiration of this area yielded no fluid so culture was unobtainable. Patient will start Bactrim DS twice daily for the next 10 days and we have scheduled an appointment with Dr. Griffin Basil early next week. Patient is advised to seek treatment in the emergency room if his pain continues, he starts to become ill, or starts to develop fever. Patient voiced understanding of this.

## 2018-03-07 NOTE — Patient Instructions (Signed)
[facility] Orthopedics [address]helia [address] Tuesday 03/11/18 [address] [place]

## 2018-03-11 DIAGNOSIS — M25512 Pain in left shoulder: Secondary | ICD-10-CM | POA: Diagnosis not present

## 2018-04-28 DIAGNOSIS — J189 Pneumonia, unspecified organism: Secondary | ICD-10-CM | POA: Diagnosis not present

## 2018-04-28 DIAGNOSIS — R05 Cough: Secondary | ICD-10-CM | POA: Diagnosis not present

## 2018-04-28 DIAGNOSIS — Z23 Encounter for immunization: Secondary | ICD-10-CM | POA: Diagnosis not present

## 2018-04-28 DIAGNOSIS — J019 Acute sinusitis, unspecified: Secondary | ICD-10-CM | POA: Diagnosis not present

## 2018-05-02 DIAGNOSIS — R569 Unspecified convulsions: Secondary | ICD-10-CM

## 2018-05-02 HISTORY — DX: Unspecified convulsions: R56.9

## 2018-05-04 ENCOUNTER — Emergency Department (HOSPITAL_COMMUNITY): Payer: 59

## 2018-05-04 ENCOUNTER — Emergency Department (HOSPITAL_COMMUNITY)
Admission: EM | Admit: 2018-05-04 | Discharge: 2018-05-04 | Disposition: A | Discharge status: Home / Self Care | Payer: 59 | Attending: Emergency Medicine | Admitting: Emergency Medicine

## 2018-05-04 ENCOUNTER — Encounter (HOSPITAL_COMMUNITY): Payer: Self-pay

## 2018-05-04 DIAGNOSIS — G40409 Other generalized epilepsy and epileptic syndromes, not intractable, without status epilepticus: Secondary | ICD-10-CM | POA: Diagnosis not present

## 2018-05-04 DIAGNOSIS — G40909 Epilepsy, unspecified, not intractable, without status epilepticus: Secondary | ICD-10-CM | POA: Insufficient documentation

## 2018-05-04 DIAGNOSIS — R569 Unspecified convulsions: Secondary | ICD-10-CM

## 2018-05-04 DIAGNOSIS — N189 Chronic kidney disease, unspecified: Secondary | ICD-10-CM | POA: Diagnosis not present

## 2018-05-04 DIAGNOSIS — J189 Pneumonia, unspecified organism: Secondary | ICD-10-CM | POA: Diagnosis not present

## 2018-05-04 DIAGNOSIS — N289 Disorder of kidney and ureter, unspecified: Secondary | ICD-10-CM | POA: Diagnosis not present

## 2018-05-04 DIAGNOSIS — R404 Transient alteration of awareness: Secondary | ICD-10-CM | POA: Diagnosis not present

## 2018-05-04 DIAGNOSIS — G40901 Epilepsy, unspecified, not intractable, with status epilepticus: Secondary | ICD-10-CM | POA: Diagnosis not present

## 2018-05-04 LAB — BASIC METABOLIC PANEL
ANION GAP: 11 (ref 5–15)
BUN: 17 mg/dL (ref 6–20)
CO2: 21 mmol/L — ABNORMAL LOW (ref 22–32)
Calcium: 9 mg/dL (ref 8.9–10.3)
Chloride: 104 mmol/L (ref 98–111)
Creatinine, Ser: 1.7 mg/dL — ABNORMAL HIGH (ref 0.61–1.24)
GFR calc Af Amer: 54 mL/min — ABNORMAL LOW (ref >60)
GFR, EST NON AFRICAN AMERICAN: 47 mL/min — AB (ref >60)
GLUCOSE: 129 mg/dL — AB (ref 70–99)
POTASSIUM: 3.8 mmol/L (ref 3.5–5.1)
Sodium: 136 mmol/L (ref 135–145)

## 2018-05-04 LAB — CBC
HCT: 47.5 % (ref 39.0–52.0)
HEMOGLOBIN: 15.7 g/dL (ref 13.0–17.0)
MCH: 28.3 pg (ref 26.0–34.0)
MCHC: 33.1 g/dL (ref 30.0–36.0)
MCV: 85.7 fL (ref 80.0–100.0)
Platelets: 213 10*3/uL (ref 150–400)
RBC: 5.54 MIL/uL (ref 4.22–5.81)
RDW: 12.3 % (ref 11.5–15.5)
WBC: 7.6 10*3/uL (ref 4.0–10.5)
nRBC: 0 % (ref 0.0–0.2)

## 2018-05-04 LAB — RAPID URINE DRUG SCREEN, HOSP PERFORMED
Amphetamines: NOT DETECTED
Barbiturates: NOT DETECTED
Benzodiazepines: NOT DETECTED
COCAINE: NOT DETECTED
OPIATES: NOT DETECTED
TETRAHYDROCANNABINOL: NOT DETECTED

## 2018-05-04 LAB — CBG MONITORING, ED: GLUCOSE-CAPILLARY: 133 mg/dL — AB (ref 70–99)

## 2018-05-04 NOTE — Discharge Instructions (Addendum)
Please follow-up with the neurology service listed above, there are 3 different phone numbers, you may follow-up with any of them.  You will need to have an EEG performed to make sure that there is no signs of a seizure focus in your brain.  At this time he do not need to be on daily medication to prevent seizures however you may need this if you have another seizure.  You are not to drive, you are not legally allowed to until you are cleared by a neurologist.  If you should have another seizure, please return to the emergency department.

## 2018-05-04 NOTE — ED Triage Notes (Signed)
Pt comes via [facility] from home, recent dx of PNA, finished antibiotics several days ago, had grand mal seizure tonight about 3-4 minutes, postictal, no hx of seizures. Pt diaphoretic with EMS and warm to touch. ETOH today, appx 6 beers.

## 2018-05-04 NOTE — ED Notes (Signed)
Placed on 2L [place]

## 2018-05-04 NOTE — ED Provider Notes (Signed)
[facility] EMERGENCY DEPARTMENT Provider Note   CSN: [id number] Arrival date & time: 05/04/18  0404     History   Chief Complaint Chief Complaint  Patient presents with  . Seizures    HPI Jay Cook is a 47 y.o. male.  HPI  The patient is a 47 year old male, he has a history of hyperlipidemia, he is unable to give any history as he is postictal after having a seizure.  According to his significant other who is at the bedside and who was present this evening when he had a seizure, she reports that he came out of the bathroom, sat down on the bed, laid down and almost immediately made a grunting sound with his mouth, both of his arms became stiff and they were held above the head, he started having shaking of his body, he continued to shake and fell off the bed.  This episode lasted less than 2 minutes, promptly resolved however he was very postictal, not breathing correctly, there is lots of grunting and frothing at the mouth until he ultimately stopped.  Paramedics arrived and found the patient to be diaphoretic confused and slightly combative.  There was no medications given prehospital.  He has not had another seizure since that time.  This occurred just prior to arrival.  According to the medical record the patient was recently prescribed tramadol, that was approximately a month and a half ago but it is not clear whether he has been taking it.  He has also been taking Flexeril.  He had recently finished an antibiotic which was thought to be Zithromax for pneumonia.  He had approximately 6 beers this evening, he was in his usual state of health until this occurred.  He has never had seizures in the past.  As the patient is still postictal on level 5 caveat applies as he is unable to answer questions consistently  Past Medical History:  Diagnosis Date  . Mixed hyperlipidemia 06/28/2017    Patient Active Problem List   Diagnosis Date Noted  . Pain and swelling of  left shoulder 03/07/2018  . Mixed hyperlipidemia 06/28/2017  . Annual physical exam 06/06/2017  . 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 WRIST SURGERY          Home Medications    Prior to Admission medications   Not on File    Family History Family History  Problem Relation Age of Onset  . Hyperlipidemia Father   . Diabetes Neg Hx   . Heart attack Neg Hx   . Hypertension Neg Hx   . Sudden death Neg Hx     Social History Social History   Tobacco Use  . Smoking status: Never Smoker  . Smokeless tobacco: Never Used  Substance Use Topics  . Alcohol use: Not on file  . Drug use: Not on file     Allergies   Patient has no known allergies.   Review of Systems Review of Systems  Unable to perform ROS: Mental status change     Physical Exam Updated Vital Signs BP (!) 116/40 (BP Location: Right Arm)   Pulse 85   Temp 97.7 F (36.5 C) (Oral)   Resp 17   SpO2 100%   Physical Exam  Constitutional: He appears well-developed and well-nourished. No distress.  HENT:  Head: Normocephalic and atraumatic.  Mouth/Throat: Oropharynx is clear and moist. No oropharyngeal exudate.  No bleeding of the tongue, no  injuries to the tongue or the teeth  Eyes: Pupils are equal, round, and reactive to light. Conjunctivae and EOM are normal. Right eye exhibits no discharge. Left eye exhibits no discharge. No scleral icterus.  Neck: Normal range of motion. Neck supple. No JVD present. No thyromegaly present.  Cardiovascular: Normal rate, regular rhythm, normal heart sounds and intact distal pulses. Exam reveals no gallop and no friction rub.  No murmur heard. Pulmonary/Chest: Effort normal and breath sounds normal. No respiratory distress. He has no wheezes. He has no rales.  Abdominal: Soft. Bowel sounds are normal. He exhibits no distension and no mass. There is no tenderness.  Musculoskeletal: Normal range of motion. He exhibits  no edema or tenderness.  Lymphadenopathy:    He has no cervical adenopathy.  Neurological: He is alert. Coordination normal.  The patient is able to move all 4 extremities, he has normal strength, his pupils are normal, cranial nerves III through XII are normal, he is somnolent but arousable to voice and is able to follow commands.  His memory is not intact, he has some repetitive statements  Skin: Skin is warm and dry. No rash noted. No erythema.  Psychiatric: He has a normal mood and affect. His behavior is normal.  Nursing note and vitals reviewed.    ED Treatments / Results  Labs (all labs ordered are listed, but only abnormal results are displayed) Labs Reviewed  BASIC METABOLIC PANEL - Abnormal; Notable for the following components:      Result Value   CO2 21 (*)    Glucose, Bld 129 (*)    Creatinine, Ser 1.70 (*)    GFR calc non Af Amer 47 (*)    GFR calc Af Amer 54 (*)    All other components within normal limits  CBG MONITORING, ED - Abnormal; Notable for the following components:   Glucose-Capillary 133 (*)    All other components within normal limits  CBC  RAPID URINE DRUG SCREEN, HOSP PERFORMED    EKG None  Radiology Ct Head Wo Contrast  Result Date: 05/04/2018 CLINICAL DATA:  New onset grand mal seizure. History of hyperlipidemia. EXAM: CT HEAD WITHOUT CONTRAST TECHNIQUE: Contiguous axial images were obtained from the base of the skull through the vertex without intravenous contrast. COMPARISON:  Sinus CT June 08, 2016 FINDINGS: BRAIN: No intraparenchymal hemorrhage, mass effect nor midline shift. The ventricles and sulci are normal. No acute large vascular territory infarcts. No abnormal extra-axial fluid collections. Basal cisterns are patent. VASCULAR: Unremarkable. SKULL/SOFT TISSUES: No skull fracture. No significant soft tissue swelling. ORBITS/SINUSES: The included ocular globes and orbital contents are normal.Severe paranasal sinusitis. Mastoid air cells  are well aerated. OTHER: None. IMPRESSION: 1. Normal CT HEAD without contrast. 2. Severe paranasal sinusitis. Electronically Signed   By: Elon Alas M.D.   On: 05/04/2018 05:02    Procedures Procedures (including critical care time)  Medications Ordered in ED Medications - No data to display   Initial Impression / Assessment and Plan / ED Course  I have reviewed the triage vital signs and the nursing notes.  Pertinent labs & imaging results that were available during my care of the patient were reviewed by me and considered in my medical decision making (see chart for details).  Clinical Course as of May 04 650  Nancy Fetter May 04, 2018  4696 CT scan reveals no findings of abnormal brain however it does show severe paranasal sinus disease.   [BM]  0615 The labs show that the  patient has a minimal hyperglycemia, the creatinine is 1.7 which is slightly higher than baseline.  CBC is normal.   [BM]    Clinical Course User Index [BM] Noemi Chapel, MD   The exact etiology of the patient's symptoms is unclear.  He had a witnessed seizure, he had taken a Flexeril just prior to this occurring, tramadol would lower the seizure threshold but he denies taking any of this recently.  Will obtain a CT scan of the brain to rule out mass lesions or other sources of possible seizure, seizure precautions will be initiated in the room, he is not currently having any tachycardia or diaphoresis and continues to improve.  CT scan negative, labs are unremarkable except for a progressive slight renal dysfunction of which the patient is now been made aware.  His blood work did not show any other findings including no hypoglycemia.  I discussed this with the significant other at the bedside, the patient is now more awake and alert and following commands.  He has been able to ambulate and tolerate fluids.  He is aware of seizure precautions including no driving.  He has been given the follow-up information for 3  different neurology offices.  Final Clinical Impressions(s) / ED Diagnoses   Final diagnoses:  Seizure ([place])  Renal insufficiency      Noemi Chapel, MD 05/04/18 [phone]

## 2018-05-04 NOTE — ED Notes (Signed)
CBG was 133. Nurse notified.

## 2018-05-05 ENCOUNTER — Encounter: Payer: Self-pay | Admitting: Sports Medicine

## 2018-05-05 ENCOUNTER — Telehealth: Payer: Self-pay | Admitting: Sports Medicine

## 2018-05-05 ENCOUNTER — Encounter: Payer: Self-pay | Admitting: Neurology

## 2018-05-05 ENCOUNTER — Ambulatory Visit (INDEPENDENT_AMBULATORY_CARE_PROVIDER_SITE_OTHER): Payer: 59 | Admitting: Sports Medicine

## 2018-05-05 DIAGNOSIS — R569 Unspecified convulsions: Secondary | ICD-10-CM

## 2018-05-05 MED ORDER — DIAZEPAM 2.5 MG RE GEL: 2.5000 mg | Freq: Once | RECTAL | 0 refills | Status: DC

## 2018-05-05 NOTE — Telephone Encounter (Signed)
Pt called clinic stating he was treated by the ED this weekend for a seizure. Requesting PCP place neurology referral for him. He was advised at the ED that if PCP places the referral he would be seen much faster.  Routing. Urgent referral pended.

## 2018-05-05 NOTE — Progress Notes (Signed)
Subjective:    CC: New onset seizure  HPI: This is a pleasant 47 year old male who was in his previous state of health.  On Saturday he drank several beers, 6-8.  Took a Flexeril as he had some back pain and then went to bed.  At around 3 AM his girlfriend noted his arms flung up in the air and he started shaking violently.  This lasted for about 20 seconds, and then stopped.  He did have some foaming at the mouth, eyes rolled back, he made some moaning noises, and bit his lips and tongue as well as became incontinent.  She called EMS, when they arrived he was taken to the emergency department.  He was postictal in the emergency department but moving all 4 extremities.  He was evaluated with a head CT, CBC, BMP and urine drug screen were normal.  He was discharged with outpatient follow-up.  Has not had any further seizures, but does have persistent numbness on the left side of his tongue.  He is not driving.  I reviewed the past medical history, family history, social history, surgical history, and allergies today and no changes were needed.  Please see the problem list section below in epic for further details.  Past Medical History: Past Medical History:  Diagnosis Date  . Mixed hyperlipidemia 06/28/2017   Past Surgical History: Past Surgical History:  Procedure Laterality Date  . WRIST SURGERY     Social History: Social History   Socioeconomic History  . Marital status: Married    Spouse name: Not on file  . Number of children: Not on file  . Years of education: Not on file  . Highest education level: Not on file  Occupational History  . Not on file  Social Needs  . Financial resource strain: Not on file  . Food insecurity:    Worry: Not on file    Inability: Not on file  . Transportation needs:    Medical: Not on file    Non-medical: Not on file  Tobacco Use  . Smoking status: Never Smoker  . Smokeless tobacco: Never Used  Substance and Sexual Activity  . Alcohol use:  Not on file  . Drug use: Not on file  . Sexual activity: Not on file  Lifestyle  . Physical activity:    Days per week: Not on file    Minutes per session: Not on file  . Stress: Not on file  Relationships  . Social connections:    Talks on phone: Not on file    Gets together: Not on file    Attends religious service: Not on file    Active member of club or organization: Not on file    Attends meetings of clubs or organizations: Not on file    Relationship status: Not on file  Other Topics Concern  . Not on file  Social History Narrative  . Not on file   Family History: Family History  Problem Relation Age of Onset  . Hyperlipidemia Father   . Diabetes Neg Hx   . Heart attack Neg Hx   . Hypertension Neg Hx   . Sudden death Neg Hx    Allergies: No Known Allergies Medications: See med rec.  Review of Systems: No fevers, chills, night sweats, weight loss, chest pain, or shortness of breath.   Objective:    General: Well Developed, well nourished, and in no acute distress.  Neuro: Alert and oriented x3, extra-ocular muscles intact, sensation grossly intact.  Cranial nerves II through XII are intact with the exception of left-sided tongue sensation. HEENT: Normocephalic, atraumatic, pupils equal round reactive to light, neck supple, no masses, no lymphadenopathy, thyroid nonpalpable.  Skin: Warm and dry, no rashes. Cardiac: Regular rate and rhythm, no murmurs rubs or gallops, no lower extremity edema.  Respiratory: Clear to auscultation bilaterally. Not using accessory muscles, speaking in full sentences.  Impression and Recommendations:    Seizure [facility]) New onset seizure Saturday night. He did drink 6 beers beforehand, and took a Flexeril. No drug use, negative UDS in the ED, negative CBC, BMP. He does need to see a neurologist, I am going to add a brain MRI with and without contrast. He was advised to avoid driving until further evaluation by neurology. Prescription  given for rectal diazepam for seizure abortion if occurs at home. ___________________________________________ Gwen Her. Dianah Field, M.D., ABFM., CAQSM. Primary Care and Sports Medicine [facility]  Adjunct Professor of [employer]

## 2018-05-05 NOTE — Assessment & Plan Note (Signed)
New onset seizure Saturday night. He did drink 6 beers beforehand, and took a Flexeril. No drug use, negative UDS in the ED, negative CBC, BMP. He does need to see a neurologist, I am going to add a brain MRI with and without contrast. He was advised to avoid driving until further evaluation by neurology. Prescription given for rectal diazepam for seizure abortion if occurs at home.

## 2018-05-05 NOTE — Patient Instructions (Signed)
Seizure, Adult A seizure is a sudden burst of abnormal electrical activity in the brain. The abnormal activity temporarily interrupts normal brain function, causing a person to experience any of the following:  Involuntary movements.  Changes in awareness or consciousness.  Uncontrollable shaking (convulsions).  Seizures usually last from 30 seconds to 2 minutes. They usually do not cause permanent brain damage unless they are prolonged. What can cause a seizure to happen? Seizures can happen for many reasons including:  A fever.  Low blood sugar.  A medicine.  An illnesses.  A brain injury.  Some people who have a seizure never have another one. People who have repeated seizures have a condition called epilepsy. What are the symptoms of a seizure? Symptoms of a seizure vary greatly from person to person. They include:  Convulsions.  Stiffening of the body.  Involuntary movements of the arms or legs.  Loss of consciousness.  Breathing problems.  Falling suddenly.  Confusion.  Head nodding.  Eye blinking or fluttering.  Lip smacking.  Drooling.  Rapid eye movements.  Grunting.  Loss of bladder control and bowel control.  Staring.  Unresponsiveness.  Some people have symptoms right before a seizure happens (aura) and right after a seizure happens. Symptoms of an aura include:  Fear or anxiety.  Nausea.  Feeling like the room is spinning (vertigo).  A feeling of having seen or heard something before (deja vu).  Odd tastes or smells.  Changes in vision, such as seeing flashing lights or spots.  Symptoms that may follow a seizure include:  Confusion.  Sleepiness.  Headache.  Weakness of one side of the body.  Follow these instructions at home: Medicines   Take over-the-counter and prescription medicines only as told by your health care provider.  Avoid any substances that may prevent your medicine from working properly, such as  alcohol. Activity  Do not drive, swim, or do any other activities that would be dangerous if you had another seizure. Wait until your health care provider approves.  If you live in the U[place] check with your local DMV (department of motor vehicles) to find out about the local driving laws. Each state has specific rules about when you can legally return to driving.  Get enough rest. Lack of sleep can make seizures more likely to occur. Educating others Teach friends and family what to do if you have a seizure. They should:  Lay you on the ground to prevent a fall.  Cushion your head and body.  Loosen any tight clothing around your neck.  Turn you on your side. If vomiting occurs, this helps keep your airway clear.  Stay with you until you recover.  Not hold you down. Holding you down will not stop the seizure.  Not put anything in your mouth.  Know whether or not you need emergency care.  General instructions  Contact your health care provider each time you have a seizure.  Avoid anything that has ever triggered a seizure for you.  Keep a seizure diary. Record what you remember about each seizure, especially anything that might have triggered the seizure.  Keep all follow-up visits as told by your health care provider. This is important. Contact a health care provider if:  You have another seizure.  You have seizures more often.  Your seizure symptoms change.  You continue to have seizures with treatment.  You have symptoms of an infection or illness. They might increase your risk of having a seizure. Get help  right away if:  You have a seizure: ? That lasts longer than 5 minutes. ? That is different than previous seizures. ? That leaves you unable to speak or use a part of your body. ? That makes it harder to breathe. ? After a head injury.  You have: ? Multiple seizures in a row. ? Confusion or a severe headache right after a seizure.  You are having  seizures more often.  You do not wake up immediately after a seizure.  You injure yourself during a seizure. These symptoms may represent a serious problem that is an emergency. Do not wait to see if the symptoms will go away. Get medical help right away. Call your local emergency services (911 in the [place] Do not drive yourself to the hospital. This information is not intended to replace advice given to you by your health care provider. Make sure you discuss any questions you have with your health care provider. Document Released: 06/15/2000 Document Revised: 02/12/2016 Document Reviewed: 01/20/2016 Elsevier Interactive Patient Education  [employer].

## 2018-05-05 NOTE — Assessment & Plan Note (Signed)
New seizure, seen in the ED, referral to neurology.

## 2018-05-06 ENCOUNTER — Ambulatory Visit (HOSPITAL_COMMUNITY)
Admission: RE | Admit: 2018-05-06 | Discharge: 2018-05-06 | Disposition: A | Discharge status: Home / Self Care | Payer: 59 | Source: Ambulatory Visit | Attending: Sports Medicine | Admitting: Sports Medicine

## 2018-05-06 DIAGNOSIS — R569 Unspecified convulsions: Secondary | ICD-10-CM | POA: Diagnosis present

## 2018-05-06 DIAGNOSIS — J329 Chronic sinusitis, unspecified: Secondary | ICD-10-CM | POA: Diagnosis not present

## 2018-05-06 MED ORDER — GADOBUTROL 1 MMOL/ML IV SOLN
10.0000 mL | Freq: Once | INTRAVENOUS | Status: AC | PRN
  Administered 2018-05-06: 10 mL via INTRAVENOUS

## 2018-05-07 DIAGNOSIS — E538 Deficiency of other specified B group vitamins: Secondary | ICD-10-CM | POA: Diagnosis not present

## 2018-05-07 DIAGNOSIS — E559 Vitamin D deficiency, unspecified: Secondary | ICD-10-CM | POA: Diagnosis not present

## 2018-05-07 DIAGNOSIS — E531 Pyridoxine deficiency: Secondary | ICD-10-CM | POA: Diagnosis not present

## 2018-05-07 DIAGNOSIS — R200 Anesthesia of skin: Secondary | ICD-10-CM | POA: Diagnosis not present

## 2018-05-07 DIAGNOSIS — G3184 Mild cognitive impairment, so stated: Secondary | ICD-10-CM | POA: Diagnosis not present

## 2018-06-03 ENCOUNTER — Encounter: Payer: Self-pay | Admitting: Sports Medicine

## 2018-06-03 ENCOUNTER — Ambulatory Visit (INDEPENDENT_AMBULATORY_CARE_PROVIDER_SITE_OTHER): Payer: 59

## 2018-06-03 ENCOUNTER — Ambulatory Visit (INDEPENDENT_AMBULATORY_CARE_PROVIDER_SITE_OTHER): Payer: 59 | Admitting: Sports Medicine

## 2018-06-03 DIAGNOSIS — M76892 Other specified enthesopathies of left lower limb, excluding foot: Secondary | ICD-10-CM

## 2018-06-03 DIAGNOSIS — R569 Unspecified convulsions: Secondary | ICD-10-CM

## 2018-06-03 DIAGNOSIS — M25552 Pain in left hip: Secondary | ICD-10-CM | POA: Diagnosis not present

## 2018-06-03 DIAGNOSIS — R1030 Lower abdominal pain, unspecified: Secondary | ICD-10-CM

## 2018-06-03 DIAGNOSIS — R102 Pelvic and perineal pain: Secondary | ICD-10-CM | POA: Diagnosis not present

## 2018-06-03 DIAGNOSIS — E291 Testicular hypofunction: Secondary | ICD-10-CM | POA: Diagnosis not present

## 2018-06-03 DIAGNOSIS — Z125 Encounter for screening for malignant neoplasm of prostate: Secondary | ICD-10-CM | POA: Diagnosis not present

## 2018-06-03 MED ORDER — DICLOFENAC SODIUM 75 MG PO TBEC: 75.0000 mg | DELAYED_RELEASE_TABLET | Freq: Two times a day (BID) | ORAL | 3 refills | Status: DC

## 2018-06-03 NOTE — Progress Notes (Signed)
Subjective:    CC: Left hip pain  HPI: Jay Cook returns, he had a generalized tonic-clonic seizure, brain MRI was negative, he was seen at [facility], placed on Trokendi, and cleared for driving.  Overall he has done well.  3 days ago he developed left hip pain, anterior, worse with ambulation.  No trauma, he has not done any new workouts, pain is localized without radiation.  He did have some radiation of the pain to his groin and scrotum so he was seen by urology, per his report his exam was completely unremarkable.  No back pain, no constitutional symptoms, saddle numbness.  No bowel or bladder dysfunction.  I reviewed the past medical history, family history, social history, surgical history, and allergies today and no changes were needed.  Please see the problem list section below in epic for further details.  Past Medical History: Past Medical History:  Diagnosis Date  . Mixed hyperlipidemia 06/28/2017   Past Surgical History: Past Surgical History:  Procedure Laterality Date  . WRIST SURGERY     Social History: Social History   Socioeconomic History  . Marital status: Married    Spouse name: Not on file  . Number of children: Not on file  . Years of education: Not on file  . Highest education level: Not on file  Occupational History  . Not on file  Social Needs  . Financial resource strain: Not on file  . Food insecurity:    Worry: Not on file    Inability: Not on file  . Transportation needs:    Medical: Not on file    Non-medical: Not on file  Tobacco Use  . Smoking status: Never Smoker  . Smokeless tobacco: Never Used  Substance and Sexual Activity  . Alcohol use: Not on file  . Drug use: Not on file  . Sexual activity: Not on file  Lifestyle  . Physical activity:    Days per week: Not on file    Minutes per session: Not on file  . Stress: Not on file  Relationships  . Social connections:    Talks on phone: Not on file    Gets together:  Not on file    Attends religious service: Not on file    Active member of club or organization: Not on file    Attends meetings of clubs or organizations: Not on file    Relationship status: Not on file  Other Topics Concern  . Not on file  Social History Narrative  . Not on file   Family History: Family History  Problem Relation Age of Onset  . Hyperlipidemia Father   . Diabetes Neg Hx   . Heart attack Neg Hx   . Hypertension Neg Hx   . Sudden death Neg Hx    Allergies: No Known Allergies Medications: See med rec.  Review of Systems: No fevers, chills, night sweats, weight loss, chest pain, or shortness of breath.   Objective:    General: Well Developed, well nourished, and in no acute distress.  Neuro: Alert and oriented x3, extra-ocular muscles intact, sensation grossly intact.  HEENT: Normocephalic, atraumatic, pupils equal round reactive to light, neck supple, no masses, no lymphadenopathy, thyroid nonpalpable.  Skin: Warm and dry, no rashes. Cardiac: Regular rate and rhythm, no murmurs rubs or gallops, no lower extremity edema.  Respiratory: Clear to auscultation bilaterally. Not using accessory muscles, speaking in full sentences. Abdomen: Soft, nontender, nondistended, bowel sounds, no palpable masses, no guarding, rigidity, rebound  tenderness. Left hip: ROM IR: 60 Deg, ER: 60 Deg, Flexion: 120 Deg, Extension: 100 Deg, Abduction: 45 Deg, Adduction: 45 Deg Strength IR: 5/5, ER: 5/5, Flexion: 5/5, Extension: 5/5, Abduction: 5/5, Adduction: 5/5, only minimal reproduction of discomfort with resisted hip flexion Mild pain with FADIR. Pelvic alignment unremarkable to inspection and palpation. Standing hip rotation and gait without trendelenburg / unsteadiness. Greater trochanter without tenderness to palpation. No tenderness over piriformis. No SI joint tenderness and normal minimal SI movement.  Impression and Recommendations:    Seizure [facility]) Has been at [facility]  neurologic Associates. Currently on Trokendi. Okay to drive per neurology.  Hip flexor tendinitis, left With pain radiating from groin into scrotum, he did see the urologist this morning, completely benign exam. Some of his symptoms are consistent with hip flexor tendinitis, adding left hip x-rays, hip flexor tendinitis rehab exercises, return in 2 to 4 weeks, MRI if no better. Adding Voltaren for pain relief. ___________________________________________ Gwen Her. Dianah Field, M.D., ABFM., CAQSM. Primary Care and Sports Medicine [facility] [facility]  Adjunct Professor of [employer]

## 2018-06-03 NOTE — Assessment & Plan Note (Addendum)
With pain radiating from groin into scrotum, he did see the urologist this morning, completely benign exam. Some of his symptoms are consistent with hip flexor tendinitis, adding left hip x-rays, hip flexor tendinitis rehab exercises, return in 2 to 4 weeks, MRI if no better. Adding Voltaren for pain relief.

## 2018-06-03 NOTE — Assessment & Plan Note (Signed)
Has been at [facility]. Currently on Trokendi. Okay to drive per neurology.

## 2018-06-04 ENCOUNTER — Ambulatory Visit: Payer: 59 | Admitting: Sports Medicine

## 2018-06-06 ENCOUNTER — Encounter: Payer: 59 | Admitting: Sports Medicine

## 2018-06-26 ENCOUNTER — Ambulatory Visit (INDEPENDENT_AMBULATORY_CARE_PROVIDER_SITE_OTHER): Payer: 59 | Admitting: Sports Medicine

## 2018-06-26 ENCOUNTER — Encounter: Payer: Self-pay | Admitting: Sports Medicine

## 2018-06-26 DIAGNOSIS — M76892 Other specified enthesopathies of left lower limb, excluding foot: Secondary | ICD-10-CM | POA: Diagnosis not present

## 2018-06-26 DIAGNOSIS — R030 Elevated blood-pressure reading, without diagnosis of hypertension: Secondary | ICD-10-CM | POA: Diagnosis not present

## 2018-06-26 NOTE — Assessment & Plan Note (Signed)
Resolved with rehab exercises.

## 2018-06-26 NOTE — Assessment & Plan Note (Signed)
Adding a sleep study. If unremarkable we will proceed with antihypertensives.

## 2018-06-26 NOTE — Progress Notes (Signed)
  Subjective:    CC: Follow-up  HPI: Hip pain: Resolved.  Seizures: Controlled.  Elevated blood pressure: Has been elevated on multiple occasions, he does have some excessive daytime sleepiness.  Has never had a sleep study.  I reviewed the past medical history, family history, social history, surgical history, and allergies today and no changes were needed.  Please see the problem list section below in epic for further details.  Past Medical History: Past Medical History:  Diagnosis Date  . Mixed hyperlipidemia 06/28/2017   Past Surgical History: Past Surgical History:  Procedure Laterality Date  . WRIST SURGERY     Social History: Social History   Socioeconomic History  . Marital status: Married    Spouse name: Not on file  . Number of children: Not on file  . Years of education: Not on file  . Highest education level: Not on file  Occupational History  . Not on file  Social Needs  . Financial resource strain: Not on file  . Food insecurity:    Worry: Not on file    Inability: Not on file  . Transportation needs:    Medical: Not on file    Non-medical: Not on file  Tobacco Use  . Smoking status: Never Smoker  . Smokeless tobacco: Never Used  Substance and Sexual Activity  . Alcohol use: Not on file  . Drug use: Not on file  . Sexual activity: Not on file  Lifestyle  . Physical activity:    Days per week: Not on file    Minutes per session: Not on file  . Stress: Not on file  Relationships  . Social connections:    Talks on phone: Not on file    Gets together: Not on file    Attends religious service: Not on file    Active member of club or organization: Not on file    Attends meetings of clubs or organizations: Not on file    Relationship status: Not on file  Other Topics Concern  . Not on file  Social History Narrative  . Not on file   Family History: Family History  Problem Relation Age of Onset  . Hyperlipidemia Father   . Diabetes Neg Hx     . Heart attack Neg Hx   . Hypertension Neg Hx   . Sudden death Neg Hx    Allergies: No Known Allergies Medications: See med rec.  Review of Systems: No fevers, chills, night sweats, weight loss, chest pain, or shortness of breath.   Objective:    General: Well Developed, well nourished, and in no acute distress.  Neuro: Alert and oriented x3, extra-ocular muscles intact, sensation grossly intact.  HEENT: Normocephalic, atraumatic, pupils equal round reactive to light, neck supple, no masses, no lymphadenopathy, thyroid nonpalpable.  Skin: Warm and dry, no rashes. Cardiac: Regular rate and rhythm, no murmurs rubs or gallops, no lower extremity edema.  Respiratory: Clear to auscultation bilaterally. Not using accessory muscles, speaking in full sentences.  Impression and Recommendations:    Hip flexor tendinitis, left Resolved with rehab exercises.  Elevated blood pressure reading Adding a sleep study. If unremarkable we will proceed with antihypertensives. ___________________________________________ Gwen Her. Dianah Field, M.D., ABFM., CAQSM. Primary Care and Sports Medicine [facility]  Adjunct Professor of [place] [employer]

## 2018-07-04 ENCOUNTER — Ambulatory Visit: Payer: Self-pay | Admitting: Diagnostic Neuroimaging

## 2018-07-04 DIAGNOSIS — Z125 Encounter for screening for malignant neoplasm of prostate: Secondary | ICD-10-CM | POA: Diagnosis not present

## 2018-07-04 DIAGNOSIS — E291 Testicular hypofunction: Secondary | ICD-10-CM | POA: Diagnosis not present

## 2018-07-10 DIAGNOSIS — G3184 Mild cognitive impairment, so stated: Secondary | ICD-10-CM | POA: Diagnosis not present

## 2018-07-10 DIAGNOSIS — R200 Anesthesia of skin: Secondary | ICD-10-CM | POA: Diagnosis not present

## 2018-07-22 ENCOUNTER — Ambulatory Visit: Payer: 59 | Admitting: Neurology

## 2018-07-23 ENCOUNTER — Ambulatory Visit: Payer: 59 | Admitting: Sports Medicine

## 2018-07-29 DIAGNOSIS — J101 Influenza due to other identified influenza virus with other respiratory manifestations: Secondary | ICD-10-CM | POA: Diagnosis not present

## 2018-08-06 DIAGNOSIS — J029 Acute pharyngitis, unspecified: Secondary | ICD-10-CM | POA: Diagnosis not present

## 2018-08-06 DIAGNOSIS — E7800 Pure hypercholesterolemia, unspecified: Secondary | ICD-10-CM | POA: Diagnosis not present

## 2018-10-18 ENCOUNTER — Other Ambulatory Visit: Payer: Self-pay

## 2018-10-18 ENCOUNTER — Emergency Department (HOSPITAL_COMMUNITY): Payer: 59

## 2018-10-18 ENCOUNTER — Encounter (HOSPITAL_COMMUNITY): Payer: Self-pay | Admitting: Emergency Medicine

## 2018-10-18 ENCOUNTER — Emergency Department (HOSPITAL_COMMUNITY)
Admission: EM | Admit: 2018-10-18 | Discharge: 2018-10-18 | Disposition: A | Discharge status: Home / Self Care | Payer: 59 | Attending: Emergency Medicine | Admitting: Emergency Medicine

## 2018-10-18 DIAGNOSIS — R4020 Unspecified coma: Secondary | ICD-10-CM | POA: Diagnosis not present

## 2018-10-18 DIAGNOSIS — F419 Anxiety disorder, unspecified: Secondary | ICD-10-CM | POA: Diagnosis not present

## 2018-10-18 DIAGNOSIS — R4182 Altered mental status, unspecified: Secondary | ICD-10-CM | POA: Diagnosis present

## 2018-10-18 DIAGNOSIS — R4789 Other speech disturbances: Secondary | ICD-10-CM | POA: Diagnosis not present

## 2018-10-18 DIAGNOSIS — Z79899 Other long term (current) drug therapy: Secondary | ICD-10-CM | POA: Insufficient documentation

## 2018-10-18 DIAGNOSIS — F430 Acute stress reaction: Secondary | ICD-10-CM

## 2018-10-18 HISTORY — DX: Bell's palsy: G51.0

## 2018-10-18 HISTORY — DX: Unspecified convulsions: R56.9

## 2018-10-18 LAB — CBC WITH DIFFERENTIAL/PLATELET
Abs Immature Granulocytes: 0.01 10*3/uL (ref 0.00–0.07)
Basophils Absolute: 0 10*3/uL (ref 0.0–0.1)
Basophils Relative: 1 %
Eosinophils Absolute: 0.1 10*3/uL (ref 0.0–0.5)
Eosinophils Relative: 1 %
HCT: 50.6 % (ref 39.0–52.0)
Hemoglobin: 16.8 g/dL (ref 13.0–17.0)
Immature Granulocytes: 0 %
Lymphocytes Relative: 18 %
Lymphs Abs: 1.2 10*3/uL (ref 0.7–4.0)
MCH: 28.8 pg (ref 26.0–34.0)
MCHC: 33.2 g/dL (ref 30.0–36.0)
MCV: 86.6 fL (ref 80.0–100.0)
Monocytes Absolute: 0.4 10*3/uL (ref 0.1–1.0)
Monocytes Relative: 6 %
Neutro Abs: 4.8 10*3/uL (ref 1.7–7.7)
Neutrophils Relative %: 74 %
Platelets: 185 10*3/uL (ref 150–400)
RBC: 5.84 MIL/uL — ABNORMAL HIGH (ref 4.22–5.81)
RDW: 12.6 % (ref 11.5–15.5)
WBC: 6.4 10*3/uL (ref 4.0–10.5)
nRBC: 0 % (ref 0.0–0.2)

## 2018-10-18 LAB — COMPREHENSIVE METABOLIC PANEL
ALT: 33 U/L (ref 0–44)
AST: 42 U/L — ABNORMAL HIGH (ref 15–41)
Albumin: 4.5 g/dL (ref 3.5–5.0)
Alkaline Phosphatase: 46 U/L (ref 38–126)
Anion gap: 12 (ref 5–15)
BUN: 15 mg/dL (ref 6–20)
CO2: 20 mmol/L — ABNORMAL LOW (ref 22–32)
Calcium: 9.4 mg/dL (ref 8.9–10.3)
Chloride: 107 mmol/L (ref 98–111)
Creatinine, Ser: 1.51 mg/dL — ABNORMAL HIGH (ref 0.61–1.24)
GFR calc Af Amer: >60 mL/min (ref >60)
GFR calc non Af Amer: 55 mL/min — ABNORMAL LOW (ref >60)
Glucose, Bld: 117 mg/dL — ABNORMAL HIGH (ref 70–99)
Potassium: 3.9 mmol/L (ref 3.5–5.1)
Sodium: 139 mmol/L (ref 135–145)
Total Bilirubin: 0.8 mg/dL (ref 0.3–1.2)
Total Protein: 7.5 g/dL (ref 6.5–8.1)

## 2018-10-18 LAB — URINALYSIS, ROUTINE W REFLEX MICROSCOPIC
Bilirubin Urine: NEGATIVE
Glucose, UA: NEGATIVE mg/dL
Hgb urine dipstick: NEGATIVE
Ketones, ur: NEGATIVE mg/dL
Leukocytes,Ua: NEGATIVE
Nitrite: NEGATIVE
Protein, ur: NEGATIVE mg/dL
Specific Gravity, Urine: 1.013 (ref 1.005–1.030)
pH: 8 (ref 5.0–8.0)

## 2018-10-18 LAB — RAPID URINE DRUG SCREEN, HOSP PERFORMED
Amphetamines: NOT DETECTED
Barbiturates: NOT DETECTED
Benzodiazepines: NOT DETECTED
Cocaine: NOT DETECTED
Opiates: NOT DETECTED
Tetrahydrocannabinol: NOT DETECTED

## 2018-10-18 LAB — MAGNESIUM: Magnesium: 2 mg/dL (ref 1.7–2.4)

## 2018-10-18 LAB — ETHANOL: Alcohol, Ethyl (B): <10 mg/dL (ref <10)

## 2018-10-18 LAB — CBG MONITORING, ED: Glucose-Capillary: 109 mg/dL — ABNORMAL HIGH (ref 70–99)

## 2018-10-18 LAB — AMMONIA: Ammonia: 10 umol/L (ref 9–35)

## 2018-10-18 MED ORDER — LORAZEPAM 1 MG PO TABS
0.5000 mg | ORAL_TABLET | Freq: Once | ORAL | Status: AC
  Administered 2018-10-18: 0.5 mg via ORAL
  Filled 2018-10-18: qty 1
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
## 2018-10-18 NOTE — Discharge Instructions (Signed)
Your tests have been normal - your MRI and EEG showed no acute findings -  The psychiatry team believes that you are most likely effected by stress and anxiety driving the majority of your symptoms and recommend that you see a psychiatrist as an outpatient.  Substance Abuse Treatment Programs  Intensive Outpatient Programs [facility]     [address], [place]       [facility] [place] [address] [place], [place]  [place]     (Inpatient and outpatient)     [address]           [place] [phone] (Suboxone and Methadone)  [place], [place] [address]      [place] [address] [place], [place]  [facility] [place] (Outpatient/Inpatient, [place])    (insurance only) [phone]             [facility] ([place]) [place], [place]     [facility]     [address]     [place], [place]       [facility] (for caregivers and family) [phone] Kristeen Mans. [place], [place]      Residential Treatment Programs [facility]      [address] [place], [place] [address]  [phone]       [facility] [address], [place], [place] [address] [phone]  [facility] [place]        [phone]       [facility] [phone]  [facility] ([place]             [place], [place] or [place] of [place], [address] [phone]  [facility]    [address]      [place], [place]       [facility] [address] [place], [place]  [place]   [address] [place], [place] [address]     [phone]      Admissions: 8am-3pm M-F  [facility] (RTS) [address] [place], [place]  [facility] Program: Residential Program [phone] Days)   [place], [place] or [phone]     ADATC: [facility] [place], [place] (Walk in Hours over the weekend or  by referral)  [facility] [place], [place], [place] [address] [phone]  Crisis Mobile: [facility]:  [phone] (for crisis response 24 hours a day) [facility] Hotline:      [phone] Outpatient Psychiatry and Counseling  [facility]: Mobile Crisis Management 24 hours:  [phone]  [facility] of the [employer] sliding scale fee and walk in schedule: M-F 8am-12pm/1pm-3pm [place], [place] [address] [place], [place] [address] [phone]  [facility] (Formerly known as [facility] [employer])- new patient walk-in appointments available Monday - Friday 8am -3pm.          [address] [place], [place] [address] [phone] or crisis line- [place] [facility]/ Intensive Outpatient Therapy Program [place], [place] [address] [place]      [phone] [address], [place] [address]                 [place]   [facility] [phone]. [place], [place] [address]   [employer] of Care          [address] Johnette Abraham  [place], [place] [address]       [phone]  [facility] [address] [place], [place] [address] [phone]  [facility]    [address] [place], [place] [address]     [place], [place] Joycelyn Man     [place] [address]     [phone]       [facility] [place] [address]  [facility]     [address], [place], [place], [place] [address] [place]     [address]     [place], [place] [address]     [phone]       [facility] [address] [place], [place] [address] [phone] Resources for Temporary Residential Assistance/Crisis [facility]) M-F 8am-3pm   [address], [place] [address]   [phone] Services include: laundry, barbering, support groups, case management, phone  & computer access, showers, AA/NA mtgs, mental health/substance abuse nurse, job skills class, disability information, VA assistance, spiritual classes, etc.   [facility]   [address] [place]     [place]              [employer] (women and children)       [place]. [place], [place] [address] [phone] [email or web address] for application and process Application Required  [facility]   [address]    [place] [address]     [phone]  [place], [place] [address] [phone](UORVIFBP application appt.) Application Required  [facility] (women only)    [address]     [place], [place] [address]     [phone]      Intake starts 6pm daily Need valid ID, SSC, & Police report [employer] [address] [place],  [place] [phone] Application Required  [employer] (men only)     [place].      [place], [place]       [place] (Pregnant women only) [address] [place], [place]  [facility]      [place] [address].      [place], [place] [address]     [phone]             [facility] [address] [place], [place] 90 day commitment/SA/Application process  [facility](men only)     [address]     [place], [place]       Check-in at [facility] [address] [place], [place] [address] [phone] Men/Women/Women and Children must be there by 7 pm  [place], [place]

## 2018-10-18 NOTE — ED Triage Notes (Signed)
Patient reports numbness/tingling sensation in face (both sides) as well as bilateral lower legs onset 2 days ago. He reports "brain fog" and feeling in and out of it recently, worse since last night. Patient's wife on speaker phone stated that he was talking out of his head last night, for example asked, "what is my stomach?" He endorses history of 1 grand-mal seizure back in November and was placed on Trokendi XR, which he's been compliant with. His wife also reported a possible "small seizure" on Sunday, in which patient appeared confused and tired afterward (also states he bit his tongue). Patient currently A&O x 4. C/o generalized weakness, but all extremities strong bilaterally.

## 2018-10-18 NOTE — ED Provider Notes (Signed)
[name] EMERGENCY DEPARTMENT Provider Note   CSN: [id number] Arrival date & time: 10/18/18  1033    History   Chief Complaint Chief Complaint  Patient presents with  . Altered Mental Status    HPI Jay Cook is a 48 y.o. male.     HPI Patient with new onset seizure in November of last year started on antiepileptics.  Has not had a witnessed tonic-clonic seizure since.  Per girlfriend, [name] said the patient was complaining of not feeling well 6 days ago.  Has been under increased stress and filing for unemployment.  Over the last 2 days has had increased delusional thought, memory deficits, decreased sleep and increasing agitation.  Patient is complaining of tingling to his tongue and bilateral lower extremities.  Is on his cell phone and thinks he may be having a tonic-clonic seizure.  He denies any recent alcohol use and prior to that was not drinking daily per girlfriend.  No new medications the patient did drink a energy drink yesterday evening.  No fever or chills.  No neck pain or stiffness.  Girlfriend did state patient had several episodes of staring off into space yesterday. Past Medical History:  Diagnosis Date  . Bell's palsy   . Mixed hyperlipidemia 06/28/2017  . Seizures ([place]) 05/2018   had 1 grand-mal seizure, placed on Trokendi XR    Patient Active Problem List   Diagnosis Date Noted  . Elevated blood pressure reading 06/26/2018  . Hip flexor tendinitis, left 06/03/2018  . Seizure ([place]) 05/05/2018  . Pain and swelling of left shoulder 03/07/2018  . Mixed hyperlipidemia 06/28/2017  . Annual physical exam 06/06/2017  . 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 WRIST SURGERY          Home Medications    Prior to Admission medications   Medication Sig Start Date End Date Taking? Authorizing Provider  TROKENDI XR 100 MG CP24 Take 100 mg by mouth daily. 05/08/18  Yes [provider]    Family History Family History  Problem Relation Age of Onset  . Hyperlipidemia Father   . Diabetes Neg Hx   . Heart attack Neg Hx   . Hypertension Neg Hx   . Sudden death Neg Hx     Social History Social History   Tobacco Use  . Smoking status: Never Smoker  . Smokeless tobacco: Never Used  Substance Use Topics  . Alcohol use: Yes    Alcohol/week: 12.0 standard drinks    Types: 12 Cans of beer per week  . Drug use: Never     Allergies   Patient has no known allergies.   Review of Systems Review of Systems  Constitutional: Negative for chills and fever.  HENT: Negative for sore throat and trouble swallowing.   Eyes: Negative for visual disturbance.  Respiratory: Negative for cough and shortness of breath.   Cardiovascular: Negative for chest pain.  Gastrointestinal: Negative for abdominal pain, diarrhea, nausea and vomiting.  Musculoskeletal: Negative for back pain, myalgias and neck pain.  Skin: Negative for rash and wound.  Neurological: Positive for tremors. Negative for dizziness, weakness, light-headedness, numbness and headaches.  Psychiatric/Behavioral: Positive for agitation, behavioral problems, confusion and sleep disturbance.  All other systems reviewed and are negative.    Physical Exam Updated Vital Signs BP 140/88   Pulse 73   Temp 98.7 F (37.1 C) (Oral)   Resp 16   SpO2 99%  Physical Exam Vitals signs and nursing note reviewed.  Constitutional:      General: He is not in acute distress.    Appearance: He is well-developed. He is not ill-appearing.  HENT:     Head: Normocephalic and atraumatic.     Comments: No obvious head injury.  No intraoral trauma.  Cranial nerves II through XII intact.    Nose: Nose normal.     Mouth/Throat:     Mouth: Mucous membranes are moist.  Eyes:     Extraocular Movements: Extraocular movements intact.     Pupils: Pupils are equal, round, and reactive to light.  Neck:     Musculoskeletal: Normal  range of motion and neck supple.  Cardiovascular:     Rate and Rhythm: Normal rate and regular rhythm.     Heart sounds: No murmur. No friction rub. No gallop.   Pulmonary:     Effort: Pulmonary effort is normal. No respiratory distress.     Breath sounds: Normal breath sounds. No stridor. No wheezing, rhonchi or rales.  Abdominal:     General: Bowel sounds are normal.     Palpations: Abdomen is soft.     Tenderness: There is no abdominal tenderness. There is no guarding or rebound.  Musculoskeletal: Normal range of motion.        General: No swelling, tenderness or deformity.     Right lower leg: No edema.  Skin:    General: Skin is warm and dry.     Capillary Refill: Capillary refill takes less than 2 seconds.     Coloration: Skin is not jaundiced.     Findings: No bruising, erythema, lesion or rash.  Neurological:     General: No focal deficit present.     Mental Status: He is alert and oriented to person, place, and time.     Comments: 5/5 motor in all extremities.  Sensation to light touch intact.  Bilateral finger-nose testing intact.  DTRs to bilateral patella ligaments intact.  Psychiatric:     Comments: Patient is perseverating over finding diagnosis on his cell phone.  Short-term memory deficits.      ED Treatments / Results  Labs (all labs ordered are listed, but only abnormal results are displayed) Labs Reviewed  CBC WITH DIFFERENTIAL/PLATELET - Abnormal; Notable for the following components:      Result Value   RBC 5.84 (*)    All other components within normal limits  COMPREHENSIVE METABOLIC PANEL - Abnormal; Notable for the following components:   CO2 20 (*)    Glucose, Bld 117 (*)    Creatinine, Ser 1.51 (*)    AST 42 (*)    GFR calc non Af Amer 55 (*)    All other components within normal limits  CBG MONITORING, ED - Abnormal; Notable for the following components:   Glucose-Capillary 109 (*)    All other components within normal limits  AMMONIA   ETHANOL  RAPID URINE DRUG SCREEN, HOSP PERFORMED  MAGNESIUM  URINALYSIS, ROUTINE W REFLEX MICROSCOPIC    EKG EKG Interpretation  Date/Time:  Saturday October 18 2018 10:55:27 EDT Ventricular Rate:  70 PR Interval:    QRS Duration: 107 QT Interval:  411 QTC Calculation: 444 R Axis:   -10 Text Interpretation:  Sinus rhythm Probable left atrial enlargement Left ventricular hypertrophy Confirmed by Julianne Rice [phone]) on 10/18/2018 11:54:06 AM   Radiology Ct Head Wo Contrast  Result Date: 10/18/2018 CLINICAL DATA:  Altered level of consciousness EXAM: CT HEAD  WITHOUT CONTRAST TECHNIQUE: Contiguous axial images were obtained from the base of the skull through the vertex without intravenous contrast. COMPARISON:  MR brain 05/06/2018 FINDINGS: Brain: No evidence of acute infarction, hemorrhage, hydrocephalus, extra-axial collection or mass lesion/mass effect. Cerebellar tonsils extend into the foramen magnum. Vascular: No hyperdense vessel or unexpected calcification. Skull: No osseous abnormality. Sinuses/Orbits: Visualized paranasal sinuses are clear. Visualized mastoid sinuses are clear. Visualized orbits demonstrate no focal abnormality. Other: None IMPRESSION: 1. No acute intracranial pathology. 2. Cerebellar ectopia. Electronically Signed   By: Kathreen Devoid   On: 10/18/2018 12:32   Mr Brain Wo Contrast  Result Date: 10/18/2018 CLINICAL DATA:  48 year old male with unexplained altered mental status. EXAM: MRI HEAD WITHOUT CONTRAST TECHNIQUE: Multiplanar, multiecho pulse sequences of the brain and surrounding structures were obtained without intravenous contrast. COMPARISON:  Head CT earlier today.  Brain MRI 05/06/2018. FINDINGS: Brain: Stable and normal cerebral volume. No restricted diffusion to suggest acute infarction. No midline shift, mass effect, evidence of mass lesion, ventriculomegaly, extra-axial collection or acute intracranial hemorrhage. Pituitary within normal limits.  Cervicomedullary junction is normal, no cerebellar ectopia. Small chronic/congenital appearing adhesion in the right lateral ventricle atrium on series 11, image 14 is unchanged since 2017 paranasal sinus CT and compatible with normal variation. No encephalomalacia or chronic cerebral blood products identified. Pearline Cables and white matter signal is within normal limits. Vascular: Major intracranial vascular flow voids are stable and within normal limits. Skull and upper cervical spine: Mild disc and endplate degeneration in the visible cervical spine. Visualized bone marrow signal is within normal limits. Sinuses/Orbits: Paranasal sinus disease in 2019 has largely resolved. Mild residual right sphenoid mucosal thickening. Orbits soft tissues appear negative. Other: Mastoids remain clear. Visible internal auditory structures appear normal. Negative scalp and face soft tissues. IMPRESSION: 1. Stable since 2019 and negative noncontrast MRI appearance of the brain. 2. Largely resolved paranasal sinus disease since 2019. Electronically Signed   By: Genevie Ann M.D.   On: 10/18/2018 17:06    Procedures Procedures (including critical care time)  Medications Ordered in ED Medications  LORazepam (ATIVAN) tablet 0.5 mg (0.5 mg Oral Given 10/18/18 1459)     Initial Impression / Assessment and Plan / ED Course  I have reviewed the triage vital signs and the nursing notes.  Pertinent labs & imaging results that were available during my care of the patient were reviewed by me and considered in my medical decision making (see chart for details).       CT head without acute findings.  Discussed with Dr. Leonel Ramsay, neurology.  Will evaluate patient in the emergency department.  Seen by Dr. Saralyn Pilar.  Recommends EEG and MRI though believes symptoms are probably psychogenic.  Will likely need TTS consult if testing shows no organic cause for the patient's symptoms.   Final Clinical Impressions(s) / ED Diagnoses    Final diagnoses:  Anxiety  Stress reaction    ED Discharge Orders    None       Julianne Rice, MD 10/19/18 [phone]

## 2018-10-18 NOTE — Procedures (Signed)
History: 48 year old male being evaluated for altered mental status  Sedation: None  Technique: This is a 21 channel routine scalp EEG performed at the bedside with bipolar and monopolar montages arranged in accordance to the international 10/20 system of electrode placement. One channel was dedicated to EKG recording.    Background: The background consists of intermixed alpha and beta activities. There is a well defined posterior dominant rhythm of 9-10 hz that attenuates with eye opening.  Sleep is not recorded.  Photic stimulation: Physiologic driving is present   EEG Abnormalities: None  Clinical Interpretation: This normal EEG is recorded in the waking state. There was no seizure or seizure predisposition recorded on this study. Please note that lack of epileptiform activity on EEG does not preclude the possibility of epilepsy.   There were episodes where he paused in his speech and appeared to look around, which could be interpreted as a staring spell, unclear if this is the event of concern.  There was no evidence of an epileptic nature to these events.  Roland Rack, MD [facility] [phone]  If 7pm- 7am, please page neurology on call as listed in [place].

## 2018-10-18 NOTE — ED Notes (Signed)
TTS in progress 

## 2018-10-18 NOTE — ED Notes (Signed)
Pt aware of need for urine sample, urinal at bedside 

## 2018-10-18 NOTE — Progress Notes (Signed)
OPT resources faxed to [phone] as requested.

## 2018-10-18 NOTE — Progress Notes (Signed)
EEG completed, results pending. 

## 2018-10-18 NOTE — ED Notes (Signed)
Patient called asking RN to come into room - upon entering, he states, "so it's not a stroke, it's MS. I have MS." During triage, when asked what made patient decide to go to ED, he stated, "I've had a stroke. The Internet is right."

## 2018-10-18 NOTE — Progress Notes (Signed)
Per Lindon Romp, NP pt is psych cleared and does not meet criteria for inpt treatment. Pt will likely benefit from OPT treatment. EDP Dr. Sabra Heck, MD and [facility] have been advised.   Lind Covert, MSW, LCSW Therapeutic Triage Specialist  [phone]

## 2018-10-18 NOTE — ED Notes (Signed)
Phlebotomy at bedside.

## 2018-10-18 NOTE — ED Notes (Signed)
Patient transported to CT 

## 2018-10-18 NOTE — ED Provider Notes (Signed)
I have seen and evaluated the patient, he appears very stable, he has had no findings of his MRI or EEG and psychiatry believes that this is most likely related to stress and anxiety.  He does not qualify for inpatient management nor does he want that.  Referred to psychiatric outpatient services.   Noemi Chapel, MD 10/18/18 2141

## 2018-10-18 NOTE — ED Triage Notes (Signed)
Significant other stated, he was confused, and delusional last night. I called the nurse and said it might be a stroke.  Pt.VAN neg. Alert and oriented x 4.

## 2018-10-18 NOTE — ED Notes (Signed)
Discharge instructions discussed with pt. Pt verbalized understanding. Pt stable and ambulatory. No signature pad available. 
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
## 2018-10-18 NOTE — ED Notes (Signed)
Pt states he "feels like he is coming and going" Pt responds to all questions and moves all extremities well. Pt talks on cell phone through exam and state he has self diagnosed as a stroke . Will inform MD.

## 2018-10-18 NOTE — ED Notes (Signed)
MD at bedside. 

## 2018-10-18 NOTE — ED Notes (Signed)
Patient transported to MRI 

## 2018-10-21 DIAGNOSIS — R200 Anesthesia of skin: Secondary | ICD-10-CM | POA: Diagnosis not present

## 2018-10-21 DIAGNOSIS — G3184 Mild cognitive impairment, so stated: Secondary | ICD-10-CM | POA: Diagnosis not present

## 2018-12-26 ENCOUNTER — Other Ambulatory Visit: Payer: 59

## 2018-12-26 ENCOUNTER — Other Ambulatory Visit: Payer: Self-pay | Admitting: Internal Medicine

## 2018-12-26 DIAGNOSIS — Z20822 Contact with and (suspected) exposure to covid-19: Secondary | ICD-10-CM

## 2018-12-31 ENCOUNTER — Telehealth: Payer: Self-pay | Admitting: *Deleted

## 2018-12-31 DIAGNOSIS — R030 Elevated blood-pressure reading, without diagnosis of hypertension: Secondary | ICD-10-CM

## 2018-12-31 LAB — NOVEL CORONAVIRUS, NAA: SARS-CoV-2, NAA: NOT DETECTED

## 2018-12-31 NOTE — Telephone Encounter (Signed)
I will just place another order.

## 2018-12-31 NOTE — Telephone Encounter (Signed)
Pt left vm today stating that he never was contacted about doing a sleep study and getting "an apparatus" for that.  You placed the order/referral back in December so I didn't know if another order had to be placed.

## 2019-01-01 ENCOUNTER — Encounter: Payer: Self-pay | Admitting: Sports Medicine

## 2019-02-01 ENCOUNTER — Emergency Department (HOSPITAL_COMMUNITY)
Admission: EM | Admit: 2019-02-01 | Discharge: 2019-02-01 | Disposition: A | Discharge status: Home / Self Care | Payer: 59 | Attending: Emergency Medicine | Admitting: Emergency Medicine

## 2019-02-01 ENCOUNTER — Emergency Department (HOSPITAL_COMMUNITY): Payer: 59

## 2019-02-01 ENCOUNTER — Encounter (HOSPITAL_COMMUNITY): Payer: Self-pay

## 2019-02-01 DIAGNOSIS — Z79899 Other long term (current) drug therapy: Secondary | ICD-10-CM | POA: Diagnosis not present

## 2019-02-01 DIAGNOSIS — G510 Bell's palsy: Secondary | ICD-10-CM | POA: Diagnosis not present

## 2019-02-01 DIAGNOSIS — R569 Unspecified convulsions: Secondary | ICD-10-CM | POA: Diagnosis present

## 2019-02-01 DIAGNOSIS — G40919 Epilepsy, unspecified, intractable, without status epilepticus: Secondary | ICD-10-CM | POA: Insufficient documentation

## 2019-02-01 LAB — URINALYSIS, ROUTINE W REFLEX MICROSCOPIC
Bilirubin Urine: NEGATIVE
Glucose, UA: NEGATIVE mg/dL
Hgb urine dipstick: NEGATIVE
Ketones, ur: NEGATIVE mg/dL
Leukocytes,Ua: NEGATIVE
Nitrite: NEGATIVE
Protein, ur: NEGATIVE mg/dL
Specific Gravity, Urine: 1.01 (ref 1.005–1.030)
pH: 6 (ref 5.0–8.0)

## 2019-02-01 LAB — RAPID URINE DRUG SCREEN, HOSP PERFORMED
Amphetamines: NOT DETECTED
Barbiturates: NOT DETECTED
Benzodiazepines: NOT DETECTED
Cocaine: NOT DETECTED
Opiates: NOT DETECTED
Tetrahydrocannabinol: NOT DETECTED

## 2019-02-01 LAB — COMPREHENSIVE METABOLIC PANEL
ALT: 29 U/L (ref 0–44)
AST: 38 U/L (ref 15–41)
Albumin: 4 g/dL (ref 3.5–5.0)
Alkaline Phosphatase: 42 U/L (ref 38–126)
Anion gap: 13 (ref 5–15)
BUN: 21 mg/dL — ABNORMAL HIGH (ref 6–20)
CO2: 18 mmol/L — ABNORMAL LOW (ref 22–32)
Calcium: 8.8 mg/dL — ABNORMAL LOW (ref 8.9–10.3)
Chloride: 106 mmol/L (ref 98–111)
Creatinine, Ser: 1.58 mg/dL — ABNORMAL HIGH (ref 0.61–1.24)
GFR calc Af Amer: 59 mL/min — ABNORMAL LOW (ref >60)
GFR calc non Af Amer: 51 mL/min — ABNORMAL LOW (ref >60)
Glucose, Bld: 119 mg/dL — ABNORMAL HIGH (ref 70–99)
Potassium: 3.7 mmol/L (ref 3.5–5.1)
Sodium: 137 mmol/L (ref 135–145)
Total Bilirubin: 0.4 mg/dL (ref 0.3–1.2)
Total Protein: 6.9 g/dL (ref 6.5–8.1)

## 2019-02-01 LAB — CBC WITH DIFFERENTIAL/PLATELET
Abs Immature Granulocytes: 0.07 10*3/uL (ref 0.00–0.07)
Basophils Absolute: 0.1 10*3/uL (ref 0.0–0.1)
Basophils Relative: 1 %
Eosinophils Absolute: 0.3 10*3/uL (ref 0.0–0.5)
Eosinophils Relative: 4 %
HCT: 47.3 % (ref 39.0–52.0)
Hemoglobin: 15.9 g/dL (ref 13.0–17.0)
Immature Granulocytes: 1 %
Lymphocytes Relative: 11 %
Lymphs Abs: 0.7 10*3/uL (ref 0.7–4.0)
MCH: 29.6 pg (ref 26.0–34.0)
MCHC: 33.6 g/dL (ref 30.0–36.0)
MCV: 88.1 fL (ref 80.0–100.0)
Monocytes Absolute: 0.5 10*3/uL (ref 0.1–1.0)
Monocytes Relative: 8 %
Neutro Abs: 4.7 10*3/uL (ref 1.7–7.7)
Neutrophils Relative %: 75 %
Platelets: 169 10*3/uL (ref 150–400)
RBC: 5.37 MIL/uL (ref 4.22–5.81)
RDW: 12.8 % (ref 11.5–15.5)
WBC: 6.2 10*3/uL (ref 4.0–10.5)
nRBC: 0 % (ref 0.0–0.2)

## 2019-02-01 LAB — ETHANOL: Alcohol, Ethyl (B): <10 mg/dL (ref <10)

## 2019-02-01 LAB — CBG MONITORING, ED: Glucose-Capillary: 117 mg/dL — ABNORMAL HIGH (ref 70–99)

## 2019-02-01 MED ORDER — SODIUM CHLORIDE 0.9 % IV BOLUS
1000.0000 mL | Freq: Once | INTRAVENOUS | Status: AC
  Administered 2019-02-01: 1000 mL via INTRAVENOUS

## 2019-02-01 NOTE — Discharge Instructions (Addendum)
Continue your medication as previously prescribed.  Follow-up with your neurologist in 2 days as scheduled.  In the meantime you are not to drive, swim, or put yourself in a position where a recurrent seizure may cause you harm.  Return to the emergency department if you experience any new and/or concerning symptoms.

## 2019-02-01 NOTE — ED Triage Notes (Addendum)
Arrived by [facility] found by 48 yo unresponsive in bed. 48 yo called patient's ex-wife that called [phone].EMS reports patient was postictal, urinary incontinence, and responsive only to painful stimuli. Wife reports to EMS that she suspects possible drug use or alcohol.

## 2019-02-01 NOTE — ED Notes (Signed)
Patient has signed and is ready to go, patient is waiting on parents to pick patient up. Wheel chair has been placed outside room for when patient transport has arrived.

## 2019-02-01 NOTE — ED Provider Notes (Signed)
[facility] DEPT Provider Note   CSN: [id number] Arrival date & time: 02/01/19  0501     History   Chief Complaint Chief Complaint  Patient presents with  . Seizures    HPI Jay Cook is a 48 y.o. male.     Patient is a 48 year old male with past medical history of Bell's palsy, prior seizure.  He is brought by EMS for evaluation of altered level of consciousness and likely seizure activity.  Patient's 35-year-old child apparently found him not responding this evening after hearing a commotion come from his room.  Patient was incontinent of urine and apparently had blood on his lips from biting his tongue during what was believed to be a seizure.  Patient was initially confused, now seems more awake and alert.  He denies any complaints.  He denies any alcohol consumption and denies any illicit drug use.  The history is provided by the patient.  Seizures Seizure activity on arrival: no   Seizure type:  Grand mal Initial focality:  None Postictal symptoms: confusion   Return to baseline: yes   Severity:  Moderate Timing:  Once Progression:  Resolved   Past Medical History:  Diagnosis Date  . Bell's palsy   . Mixed hyperlipidemia 06/28/2017  . Seizures ([place]) 05/2018   had 1 grand-mal seizure, placed on Trokendi XR    Patient Active Problem List   Diagnosis Date Noted  . Elevated blood pressure reading 06/26/2018  . Hip flexor tendinitis, left 06/03/2018  . Seizure ([employer] [facility]) 05/05/2018  . Pain and swelling of left shoulder 03/07/2018  . Mixed hyperlipidemia 06/28/2017  . Annual physical exam 06/06/2017  . 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 WRIST SURGERY          Home Medications    Prior to Admission medications   Medication Sig Start Date End Date Taking? Authorizing Provider  TROKENDI XR 100 MG CP24 Take 100 mg by mouth daily. 05/08/18   [provider]    Family  History Family History  Problem Relation Age of Onset  . Hyperlipidemia Father   . Diabetes Neg Hx   . Heart attack Neg Hx   . Hypertension Neg Hx   . Sudden death Neg Hx     Social History Social History   Tobacco Use  . Smoking status: Never Smoker  . Smokeless tobacco: Never Used  Substance Use Topics  . Alcohol use: Yes    Alcohol/week: 12.0 standard drinks    Types: 12 Cans of beer per week  . Drug use: Never     Allergies   Patient has no known allergies.   Review of Systems Review of Systems  Neurological: Positive for seizures.  All other systems reviewed and are negative.    Physical Exam Updated Vital Signs BP 122/81 (BP Location: Right Arm)   Pulse 85   Temp 98.3 F (36.8 C) (Oral)   Resp 12   Ht 6' 5"  (1.956 m)   Wt 90.7 kg   SpO2 92%   BMI 23.72 kg/m   Physical Exam Vitals signs and nursing note reviewed.  Constitutional:      General: He is not in acute distress.    Appearance: He is well-developed. He is not diaphoretic.  HENT:     Head: Normocephalic and atraumatic.  Eyes:     Extraocular Movements: Extraocular movements intact.     Pupils: Pupils are equal, round, and reactive  to light.  Neck:     Musculoskeletal: Normal range of motion and neck supple.  Cardiovascular:     Rate and Rhythm: Normal rate and regular rhythm.     Heart sounds: No murmur. No friction rub.  Pulmonary:     Effort: Pulmonary effort is normal. No respiratory distress.     Breath sounds: Normal breath sounds. No wheezing or rales.  Abdominal:     General: Bowel sounds are normal. There is no distension.     Palpations: Abdomen is soft.     Tenderness: There is no abdominal tenderness.  Musculoskeletal: Normal range of motion.  Skin:    General: Skin is warm and dry.  Neurological:     General: No focal deficit present.     Mental Status: He is alert and oriented to person, place, and time.     Cranial Nerves: No cranial nerve deficit.     Motor: No  weakness.     Coordination: Coordination normal.      ED Treatments / Results  Labs (all labs ordered are listed, but only abnormal results are displayed) Labs Reviewed  CBG MONITORING, ED - Abnormal; Notable for the following components:      Result Value   Glucose-Capillary 117 (*)    All other components within normal limits  CBC WITH DIFFERENTIAL/PLATELET  ETHANOL  COMPREHENSIVE METABOLIC PANEL  URINALYSIS, ROUTINE W REFLEX MICROSCOPIC  RAPID URINE DRUG SCREEN, HOSP PERFORMED    EKG None  Radiology No results found.  Procedures Procedures (including critical care time)  Medications Ordered in ED Medications  sodium chloride 0.9 % bolus 1,000 mL (has no administration in time range)     Initial Impression / Assessment and Plan / ED Course  I have reviewed the triage vital signs and the nursing notes.  Pertinent labs & imaging results that were available during my care of the patient were reviewed by me and considered in my medical decision making (see chart for details).  Patient with history of seizure disorder presenting with what appears to be a breakthrough seizure.  Patient began thrashing in his bed this evening and was found by his child to be less responsive.  Patient was found by EMS post ictal and brought here.  Patient has since regained consciousness is and is back to his baseline.  His head CT is unremarkable and laboratory studies are essentially normal.  Alcohol level is negative and the patient denies alcohol consumption recently.  Patient is neurologically intact and at this point I feel can safely be discharged.  He has an appointment with his neurologist in [place] in 2 days and I feel as though he can discuss his medications directly with his neurologist at that time.  Patient advised not to drive, swim, or put himself in a position where a recurrent seizure may cause him or others harm.  Final Clinical Impressions(s) / ED Diagnoses    Final diagnoses:  None    ED Discharge Orders    None       Veryl Speak, MD 02/01/19 [id number]

## 2019-02-24 ENCOUNTER — Encounter: Payer: Self-pay | Admitting: Neurology

## 2019-02-24 ENCOUNTER — Telehealth (INDEPENDENT_AMBULATORY_CARE_PROVIDER_SITE_OTHER): Payer: 59 | Admitting: Neurology

## 2019-02-24 ENCOUNTER — Other Ambulatory Visit: Payer: Self-pay

## 2019-02-24 VITALS — Ht 74.0 in | Wt 260.0 lb

## 2019-02-24 DIAGNOSIS — G40009 Localization-related (focal) (partial) idiopathic epilepsy and epileptic syndromes with seizures of localized onset, not intractable, without status epilepticus: Secondary | ICD-10-CM

## 2019-02-24 NOTE — Progress Notes (Signed)
Virtual Visit via Video Note The purpose of this virtual visit is to provide medical care while limiting exposure to the novel coronavirus.    Consent was obtained for video visit:  Yes.   Answered questions that patient had about telehealth interaction:  Yes.   I discussed the limitations, risks, security and privacy concerns of performing an evaluation and management service by telemedicine. I also discussed with the patient that there may be a patient responsible charge related to this service. The patient expressed understanding and agreed to proceed.  Pt location: Home Physician Location: office Name of referring provider:  Boyd Kerbs, MD I connected with Barton Fanny at patients initiation/request on 02/24/2019 at  9:00 AM EDT by video enabled telemedicine application and verified that I am speaking with the correct person using two identifiers. Pt MRN:  [id number] Pt DOB:  24-Apr-1971 Video Participants:  Barton Fanny   History of Present Illness:  This is a 48 year old right-handed man with a history of diet-controlled hyperlipidemia, left Bell's palsy with no residual deficits, presenting for second opinion regarding seizures. The first seizure occurred early morning 05/04/2018, he states it was in sleep, however ER notes indicate his significant other witnessed him come out of the bathroom, sit on the bed, they lay down, then almost immediately make a grunting sound, both arms stiff and held above his hed, followed by convulsive activity causing him to fall off the bed. Seizure lasted less than 2 minutes, he was very confused and slightly combative after. In the ER, head CT without contrast was unremarkable, bloodwork overall unremarkable except for slightly elevated creatinine 1.7, UDS negative. He was evaluated by neurologist Dr. Trula Ore a few days later, per notes MRI brain normal, EEG abnormal showing right temporal sharp and slow wave and mild diffuse slowing. He was started on  Trokendi. On 10/15/2018 he woke up with slurred speech, fatigue, dry mouth, and back pain. He went to the ER 3 days later for increased delusional thought, memory deficits, decreased sleep, and increasing agitation. He was also reporting tingling in his tongue and legs. Bloodwork unremarkable. Symptoms thought secondary to Trokendi, he reports being very stressed that time undergoing divorce proceedings. He was started on Sertraline and prn Xanax. He had an episode on 11/29/2018 where he was shaking and zoned out, unresponsive with diaphoresis, 911 was called but he did not go to the ER. He continued to report numbness and tingling in his tongue after the seizure. Repeat EEG showed right temporal slowing and sharp waves. Trokendi XR dose increased to 264m daily. He was back in the ER on 02/01/2019 for a nocturnal seizure, his 47year old heard a noise and found him unresponsive and thrashing in bed, incontinent of urine with tongue bite. Lamotrigine was added, he is currently on an uptitration schedule increasing to 554mBID today.   He denies any staring/unresponsive episodes, gaps in time, olfactory/gustatory hallucinations, deja vu, rising epigastric sensation, myoclonic jerks. His right arm feels numb every once in a while. He has also noticed occasional mild tremor in his right hand. He denies any headaches, dizziness, vision changes, bowel/bladder dysfunction. 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 RuTrula Oreas ordered a sleep study. He has taken Xanax only 3 or 4 times since April. He occasional drinks alcohol on the weekends and may have had a drink or 2 prior to one  of the seizures. He has a history of left Bell's palsy 8-10 years ago which resolved after a month. He states his memory is okay, "I'm just getting old," he denies missing medications or bills, or getting lost driving (discussed that he should not  be driving). Mood is pretty good.   Epilepsy Risk Factors:  He had a normal birth and early development.  There is no history of febrile convulsions, CNS infections such as meningitis/encephalitis, significant traumatic brain injury, neurosurgical procedures, or family history of seizures.  EEGs: MRI: I personally reviewed MRI Brain without contrast done 10/18/2018 which did not show any acute changes, hippocampi symmetric with no abnormal signal. 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  EEG done at Dr. Desiree Hane office in 05/2018 and 12/2018 showed right temporal slowing and sharp waves.  PAST MEDICAL HISTORY: Past Medical History:  Diagnosis Date   Bell's palsy    Mixed hyperlipidemia 06/28/2017   Seizures ([place]) 05/2018   had 1 grand-mal seizure, placed on Trokendi XR    PAST SURGICAL HISTORY: Past Surgical History:  Procedure Laterality Date   WRIST SURGERY     As a child    MEDICATIONS: Current Outpatient Medications on File Prior to Visit  Medication Sig Dispense Refill   lamoTRIgine (LAMICTAL) 25 MG tablet Take 25 mg by mouth daily. One in tab in am and 2 tab in pm (Patient taking 2 tabs BID)     sertraline (ZOLOFT) 100 MG tablet Take 100 mg by mouth at bedtime.     Topiramate ER (TROKENDI XR) 200 MG CP24 Take 200 mg by mouth.     No current facility-administered medications on file prior to visit.     ALLERGIES: No Known Allergies  FAMILY HISTORY: Family History  Problem Relation Age of Onset   Hyperlipidemia Father    Diabetes Neg Hx    Heart attack Neg Hx    Hypertension Neg Hx    Sudden death Neg Hx       Current Outpatient Medications on File Prior to Visit  Medication Sig Dispense Refill   lamoTRIgine (LAMICTAL) 25 MG tablet Take 25 mg by mouth daily. One in tab in am and 2 tab in pm     sertraline (ZOLOFT) 100 MG tablet Take 100 mg by  mouth at bedtime.     Topiramate ER (TROKENDI XR) 200 MG CP24 Take 200 mg by mouth.     No current facility-administered medications on file prior to visit.      Observations/Objective:   Vitals:   02/24/19 0837  Weight: 260 lb (117.9 kg)  Height: 6' 2"  (1.88 m)   GEN:  The patient appears stated age and is in NAD.  Neurological examination: Patient is awake, alert, oriented x 3. No aphasia or dysarthria. Intact fluency and comprehension. Remote and recent memory intact. Able to name and repeat. Cranial nerves: Extraocular movements intact with no nystagmus. No facial asymmetry. Motor: moves all extremities symmetrically, at least anti-gravity x 4. No incoordination on finger to nose testing.   Assessment and Plan:   This is a 48 year old right-handed man with a history of diet-controlled hyperlipidemia, with right temporal lobe epilepsy, presenting for second opinion. History reviewed with patient, we discussed diagnosis, results of testing, with normal MRI brain and EEG showing right temporal epileptiform discharges. We discussed the need for anti-epileptic medication, he is currently on Trokendi XR 275m and increasing dose  of Lamotrigine to 36m BID. We may further increase if necessary. We discussed [place] driving laws, no driving until 6 months seizure-free. He is asking about a sleep study, discussed effects of poor sleep on seizures, proceed with sleep study as planned. He will follow-up in 3-4 months and knows to call for any changes.    Follow Up Instructions:   -I discussed the assessment and treatment plan with the patient. The patient was provided an opportunity to ask questions and all were answered. The patient agreed with the plan and demonstrated an understanding of the instructions.   The patient was advised to call back or seek an in-person evaluation if the symptoms worsen or if the condition fails to improve as anticipated.    KCameron Sprang MD

## 2019-02-26 ENCOUNTER — Telehealth: Payer: Self-pay | Admitting: Neurology

## 2019-02-26 NOTE — Telephone Encounter (Signed)
Patient had VV with [facility] on Monday and had some follow up questions for the nurse and advice. Thanks!

## 2019-02-27 ENCOUNTER — Encounter: Payer: Self-pay | Admitting: Sports Medicine

## 2019-02-27 NOTE — Telephone Encounter (Signed)
Ok to put on Dec 18 at 2pm, thanks!

## 2019-02-27 NOTE — Telephone Encounter (Signed)
Scheduled. Pt informed.

## 2019-02-27 NOTE — Telephone Encounter (Signed)
Former pt at [facility].   Has EEG scheduled with them on 9/19. He is going to keep that appt since his insurance is getting ready to switch and he is not sure about getting an appt with [place] before it changes.  Fax number given to pt so [facility] can fax EEG results.  Pt states that his PCP is also performing a sleep study and he will have them fax the report to [place].  Pt also states that he needs to follow up with [place] in 3-4 months.   Dr. Delice Lesch, where can I schedule pt?

## 2019-03-11 ENCOUNTER — Telehealth: Payer: Self-pay | Admitting: Sports Medicine

## 2019-03-11 DIAGNOSIS — G4733 Obstructive sleep apnea (adult) (pediatric): Secondary | ICD-10-CM | POA: Insufficient documentation

## 2019-03-11 NOTE — Assessment & Plan Note (Signed)
Sleep apnea with an AHI of 18/h, ordering CPAP with auto titration.

## 2019-03-11 NOTE — Telephone Encounter (Signed)
Obstructive sleep apnea Sleep apnea with an AHI of 18/h, ordering CPAP with auto titration.

## 2019-03-16 ENCOUNTER — Telehealth: Payer: Self-pay | Admitting: Neurology

## 2019-03-16 NOTE — Telephone Encounter (Signed)
Home Sleep study results from like 2 weeks ago- Dr. Dianah Field was the one that ordered that.  That doctor is within [facility] so maybe they are in his chart and I'm just not finding them. He is having a hard time getting the results to Dr. Delice Lesch and didn't know if you could try to get these as Dr. Delice Lesch will need them. Thanks!

## 2019-03-17 NOTE — Telephone Encounter (Signed)
Arabi at [phone] to Dr. Dianah Field for Sleep Study results

## 2019-03-30 ENCOUNTER — Telehealth: Payer: Self-pay | Admitting: Neurology

## 2019-03-30 NOTE — Telephone Encounter (Signed)
Patient called to see if Dr. Delice Lesch received his EEG from [facility] done within the last two weeks. Their phone number is: [phone], if needed.

## 2019-04-01 NOTE — Telephone Encounter (Signed)
I do not have the results... 

## 2019-04-01 NOTE — Telephone Encounter (Signed)
Dr. Delice Lesch,  Are these on your desk?

## 2019-04-02 NOTE — Telephone Encounter (Signed)
Spoke with [facility]. They are faxing their last OV note and EEG results today.

## 2019-04-03 ENCOUNTER — Telehealth: Payer: Self-pay | Admitting: *Deleted

## 2019-04-03 ENCOUNTER — Telehealth: Payer: Self-pay | Admitting: Neurology

## 2019-04-03 NOTE — Telephone Encounter (Signed)
Patient had called to see if Dr. Delice Lesch had received his EEG from his former neurologist Dr. Trula Ore. She has and called patient back and let him know Dr. Delice Lesch has reviewed it. Patient has no questions - he will be switching his neurology care to this practice.

## 2019-04-03 NOTE — Telephone Encounter (Signed)
Received notes from [facility]:  EEG done 03/18/2019: normal wake and sleep EEG Per neuro note on 9/16: his sleep study was abnormal with sleep apnea and recommended CPAP. Contniue Trokendi and Lamotrigine

## 2019-04-06 ENCOUNTER — Other Ambulatory Visit: Payer: Self-pay | Admitting: Sports Medicine

## 2019-04-06 DIAGNOSIS — G4733 Obstructive sleep apnea (adult) (pediatric): Secondary | ICD-10-CM

## 2019-04-06 MED ORDER — AMBULATORY NON FORMULARY MEDICATION: 0 refills | Status: AC

## 2019-04-06 NOTE — Progress Notes (Signed)
Faxed CPAP order and sleep study results to [facility]

## 2019-04-07 ENCOUNTER — Encounter: Payer: Self-pay | Admitting: Sports Medicine

## 2019-04-27 ENCOUNTER — Telehealth: Payer: Self-pay | Admitting: Neurology

## 2019-04-27 ENCOUNTER — Other Ambulatory Visit: Payer: Self-pay | Admitting: Sports Medicine

## 2019-04-27 MED ORDER — DICLOFENAC SODIUM 75 MG PO TBEC: 75.0000 mg | DELAYED_RELEASE_TABLET | Freq: Two times a day (BID) | ORAL | 3 refills | Status: DC

## 2019-04-27 NOTE — Telephone Encounter (Signed)
Partner left msg with after hours about patient having a seizure at 2:30AM this morning. 20-30 focal seizure, then grand mal, usually sleeps after. Patient was sleeping at 7:21AM this morning when partner called to give this msg. Trokendi 243m, lamotrigine 50 mg BID. Thanks!

## 2019-04-27 NOTE — Telephone Encounter (Signed)
I have spoke with the pts mother. See previous telephone note.

## 2019-04-27 NOTE — Telephone Encounter (Signed)
Patient left vm about wanting to speak with someone regarding changing his medication. Thanks!

## 2019-04-27 NOTE — Telephone Encounter (Signed)
Left message to return call if needed. Informed that if pt is taking medications as prescribed and he returns to his normal self within 30 min after seizure then ok. If pt continues to have repetitive seizures and more frequently to call us. If pt hit is head or had an injury during seizures to go to ER.

## 2019-04-27 NOTE — Telephone Encounter (Signed)
Patient's mother lmom regarding her son Jay Cook and him havig a Seizure. She said that they have more information that they are needing to give Dr. Delice Lesch. They feel the Seizure lasted longer than they were told. Please call [phone]. Thank you

## 2019-04-28 ENCOUNTER — Other Ambulatory Visit: Payer: Self-pay

## 2019-04-28 MED ORDER — LAMOTRIGINE 100 MG PO TABS: 100.0000 mg | ORAL_TABLET | Freq: Two times a day (BID) | ORAL | 3 refills | Status: DC

## 2019-04-28 NOTE — Telephone Encounter (Signed)
If no triggers (no missed medication, alcohol, sleep deprivation), please explain to him that everyone is different as to how much medication they need. I would increase the Lamotrigine to 155m BID, continue Trokendi. If he is agreeable, pls send in new Rx for 1044mtabs BID with 6 refills, thanks!

## 2019-04-28 NOTE — Telephone Encounter (Signed)
Patient used to go to [facility] and has only seen Dr. Delice Lesch once, virtually. Patient called to report he had a focal seizure for 30 minutes on Saturday, 04/25/19, then a grand mal seizure right after that. He said Dr. Delice Lesch said if that happened, his medications will need changed.    [facility]

## 2019-04-28 NOTE — Telephone Encounter (Signed)
Patient left msg returning your call. Thanks!

## 2019-04-28 NOTE — Telephone Encounter (Signed)
Left message for pt to return call.

## 2019-04-28 NOTE — Telephone Encounter (Signed)
Pt informed. [facility]ine sent to [facility]. He has had not triggers.

## 2019-04-30 ENCOUNTER — Telehealth: Payer: Self-pay | Admitting: Neurology

## 2019-04-30 NOTE — Telephone Encounter (Signed)
Confirmed that pt should take Lamotrigine 111m in am and 1010min pm. Keep Taking Trokendi 20040md

## 2019-04-30 NOTE — Telephone Encounter (Signed)
Patient called and wants to speak to someone about his medication Lamotrigine dosage please call

## 2019-04-30 NOTE — Telephone Encounter (Signed)
Left message with the after hour service on 04-29-19 @ 5:28 pm    Caller states they have a question about medication lamotrigine. He has question about increasing dosage of the medication

## 2019-05-21 ENCOUNTER — Telehealth: Payer: Self-pay | Admitting: Neurology

## 2019-05-21 NOTE — Telephone Encounter (Signed)
Absolutely. Thanks

## 2019-05-21 NOTE — Telephone Encounter (Signed)
Patient is wanting to get a flu shot and said he needs to know if that is okay. Please call him back and let him know. Thanks!

## 2019-05-21 NOTE — Telephone Encounter (Signed)
Left message informing pt that it was absolutely fine for him to get his flu shot.

## 2019-05-21 NOTE — Telephone Encounter (Signed)
Are you ok with pt getting his flu shot?

## 2019-06-03 ENCOUNTER — Telehealth: Payer: Self-pay | Admitting: Neurology

## 2019-06-03 NOTE — Telephone Encounter (Signed)
Patient called regarding his V V on 06/15/19. He said he needed to know if he needed to schedule an EEG as well? He thought he needed to have one every 3 months. Please Call. Thank you

## 2019-06-03 NOTE — Telephone Encounter (Signed)
No need to repeat EEG every 3 months, I will see him on VV. Thanks

## 2019-06-03 NOTE — Telephone Encounter (Signed)
Informed patient repeat EEG is not necessary.

## 2019-06-10 ENCOUNTER — Telehealth: Payer: Self-pay | Admitting: Neurology

## 2019-06-10 NOTE — Telephone Encounter (Signed)
Patient is concerned that he is not getting an EEG every 3 months like his previous provider did.  Advised patient that he can discuss concern at his appointment.  Patient agreeable.

## 2019-06-10 NOTE — Telephone Encounter (Signed)
Patient wants to know why he does not need the EEG now.he states he was to have one and now we are telling him that he does not need one  Please call

## 2019-06-15 ENCOUNTER — Telehealth (INDEPENDENT_AMBULATORY_CARE_PROVIDER_SITE_OTHER): Payer: 59 | Admitting: Neurology

## 2019-06-15 ENCOUNTER — Other Ambulatory Visit: Payer: Self-pay

## 2019-06-15 ENCOUNTER — Encounter: Payer: Self-pay | Admitting: Neurology

## 2019-06-15 VITALS — Ht 78.0 in | Wt 270.0 lb

## 2019-06-15 DIAGNOSIS — E7849 Other hyperlipidemia: Secondary | ICD-10-CM

## 2019-06-15 DIAGNOSIS — G40802 Other epilepsy, not intractable, without status epilepticus: Secondary | ICD-10-CM

## 2019-06-15 DIAGNOSIS — G40009 Localization-related (focal) (partial) idiopathic epilepsy and epileptic syndromes with seizures of localized onset, not intractable, without status epilepticus: Secondary | ICD-10-CM

## 2019-06-15 MED ORDER — TROKENDI XR 200 MG PO CP24: ORAL_CAPSULE | ORAL | 3 refills | Status: DC

## 2019-06-15 MED ORDER — VALTOCO 10 MG DOSE 10 MG/0.1ML NA LIQD: 1.0000 | NASAL | 5 refills | Status: DC | PRN

## 2019-06-15 MED ORDER — LAMOTRIGINE 200 MG PO TABS: 200.0000 mg | ORAL_TABLET | Freq: Two times a day (BID) | ORAL | 11 refills | Status: DC

## 2019-06-15 NOTE — Progress Notes (Signed)
Virtual Visit via Video Note The purpose of this virtual visit is to provide medical care while limiting exposure to the novel coronavirus.    Consent was obtained for video visit:  Yes.   Answered questions that patient had about telehealth interaction:  Yes.   I discussed the limitations, risks, security and privacy concerns of performing an evaluation and management service by telemedicine. I also discussed with the patient that there may be a patient responsible charge related to this service. The patient expressed understanding and agreed to proceed.  Pt location: Home Physician Location: office Name of referring provider:  Silverio Decamp,* I connected with Jay Cook at patients initiation/request on 06/15/2019 at 10:00 AM EST by video enabled telemedicine application and verified that I am speaking with the correct person using two identifiers. Pt MRN:  [id number] Pt DOB:  03-01-1971 Video Participants:  Jay Cook   History of Present Illness:  The patient was seen as a virtual video visit on 06/15/2019. 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 Since his last visit, he reports seizures on 10/25 and 11/2, both out of sleep. Dose of Lamotrigine increased to 123m BID in October, he is also on Trokeni XR 2017mqhs. His girlfriend witnessed the seizure on 11/2, he states it was the worst yet, he was in and out of a focal seizure for 40-60 minutes where he had a change in breathing, unresponsive, with eyes to the right. He then had a GTC and vomited. He had bitten his tongue. No clear triggers. He denies any focal weakness, he was very confused and incoherent for hours after, and slept all day after. He has not recollection of events. He started using his CPAP machine on 11/5 and sleep is "1000 times better." He has minimal twitching/jumping in sleep now, at times his girlfriend would describe mild shaking. He denies any staring/unresponsive episodes during the  daytime. He has osteoarthritis in his hip and was prescribed diclofenac. He reports a 15-lb weight gain.  Records reviewed, he had an EEG in 03/2019 which was normal. Sleep study showed sleep apnea.  History On Initial Assessment 02/24/2019: This is a 89ear old right-handed man with a history of diet-controlled hyperlipidemia, left Bell's palsy with no residual deficits, presenting for second opinion regarding seizures. The first seizure occurred early morning 05/04/2018, he states it was in sleep, however ER notes indicate his significant other witnessed him come out of the bathroom, sit on the bed, they lay down, then almost immediately make a grunting sound, both arms stiff and held above his hed, followed by convulsive activity causing him to fall off the bed. Seizure lasted less than 2 minutes, he was very confused and slightly combative after. In the ER, head CT without contrast was unremarkable, bloodwork overall unremarkable except for slightly elevated creatinine 1.7, UDS negative. He was evaluated by neurologist Dr. RuTrula Ore few days later, per notes MRI brain normal, EEG abnormal showing right temporal sharp and slow wave and mild diffuse slowing. He was started on Trokendi. On 10/15/2018 he woke up with slurred speech, fatigue, dry mouth, and back pain. He went to the ER 3 days later for increased delusional thought, memory deficits, decreased sleep, and increasing agitation. He was also reporting tingling in his tongue and legs. Bloodwork unremarkable. Symptoms thought secondary to Trokendi, he reports being very stressed that time undergoing divorce proceedings. He was started on Sertraline and prn Xanax. He had an episode on 11/29/2018  where he was shaking and zoned out, unresponsive with diaphoresis, 911 was called but he did not go to the ER. He continued to report numbness and tingling in his tongue after the seizure. Repeat EEG showed right temporal slowing and sharp waves. Trokendi XR dose  increased to 239m daily. He was back in the ER on 02/01/2019 for a nocturnal seizure, his 47year old heard a noise and found him unresponsive and thrashing in bed, incontinent of urine with tongue bite. Lamotrigine was added, he is currently on an uptitration schedule increasing to 522mBID today.   He denies any staring/unresponsive episodes, gaps in time, olfactory/gustatory hallucinations, deja vu, rising epigastric sensation, myoclonic jerks. His right arm feels numb every once in a while. He has also noticed occasional mild tremor in his right hand. He denies any headaches, dizziness, vision changes, bowel/bladder dysfunction. 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 RuTrula Oreas ordered a sleep study. He has taken Xanax only 3 or 4 times since April. He occasional drinks alcohol on the weekends and may have had a drink or 2 prior to one of the seizures. He has a history of left Bell's palsy 8-10 years ago which resolved after a month. He states his memory is okay, "I'm just getting old," he denies missing medications or bills, or getting lost driving (discussed that he should not be driving). Mood is pretty good.   Epilepsy Risk Factors:  He had a normal birth and early development.  There is no history of febrile convulsions, CNS infections such as meningitis/encephalitis, significant traumatic brain injury, neurosurgical procedures, or family history of seizures.  EEGs: MRI: I personally reviewed MRI Brain without contrast done 10/18/2018 which did not show any acute changes, hippocampi symmetric with no abnormal signal. 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  EEG done at Dr. [facility]ff[facility] in 05/2018 and 12/2018 showed right temporal slowing and sharp waves.  Outpatient Encounter Medications  as of 06/15/2019  Medication Sig  . AMBULATORY NON FORMULARY MEDICATION Continuous positive airway pressure (CPAP) machine set on AutoPAP (4-20 cmH2O), with all supplemental supplies as needed.  . diclofenac (VOLTAREN) 75 MG EC tablet Take 1 tablet (75 mg total) by mouth 2 (two) times daily.  . sertraline (ZOLOFT) 100 MG tablet Take 100 mg by mouth at bedtime (Patient taking 15072maily)  . Topiramate ER (TROKENDI XR) 200 MG CP24 Take 1 tablet every night  .  lamoTRIgine (LAMICTAL) 100 MG tablet Take 1 tablet (100 mg total) by mouth 2 (two) times daily.   No facility-administered encounter medications on file as of 06/15/2019.    Observations/Objective:   Vitals:   06/15/19 0826  Weight: 270 lb (122.5 kg)  Height: 6' 6"  (1.981 m)   GEN:  The patient appears stated age and is in NAD.  Neurological examination: Patient is awake, alert, oriented x 3. No aphasia or dysarthria. Intact fluency and comprehension. Remote and recent memory intact. Able to name and repeat. Cranial nerves: Extraocular movements intact with no nystagmus. No facial asymmetry. Motor: moves all extremities symmetrically, at least anti-gravity x 4.   Assessment and Plan:   This is a [age] RH man with a history of diet-controlled hyperlipidemia, with right temporal lobe epilepsy. He has had 2 seizures since his last visit, we discussed increasing  Lamotrigine to 236m BID, continue Trokendi XR 2014mqhs. He typically has a prolonged focal seizure before progressing to a convulsion. A prescription for intranasal diazepam will be sent today to administer for prolonged focal seizure. Side effects discussed. We discussed diagnosis and prognosis of temporal lobe epilepsy. He is asking about repeating EEGs every 3 months, I discussed with patient that this is not necessary, unless there is a change in symptoms or we would be looking into further options such as presurgical evaluation. Otherwise no need for routine EEGs every 3  months. We again discussed [place] driving laws, no driving until 6 months seizure-free. If all seizures are nocturnal, he may [facility]. He will follow-up in 3 months and knows to call for any changes.    Follow Up Instructions:   -I discussed the assessment and treatment plan with the patient. The patient was provided an opportunity to ask questions and all were answered. The patient agreed with the plan and demonstrated an understanding of the instructions.   The patient was advised to call back or seek an in-person evaluation if the symptoms worsen or if the condition fails to improve as anticipated.    KaCameron SprangMD

## 2019-06-19 ENCOUNTER — Ambulatory Visit: Payer: 59 | Admitting: Neurology

## 2019-07-08 ENCOUNTER — Telehealth: Payer: Self-pay | Admitting: Neurology

## 2019-07-08 NOTE — Telephone Encounter (Signed)
Patient called in needing to check with Dr. Delice Lesch and see if it would be okay and safe for him to take the Vaccine and he has some questions. Please Call. Thank you

## 2019-07-08 NOTE — Telephone Encounter (Signed)
Would recommend he take the vaccine once available. Pls see how he has been doing on higher dose of Lamotrigine and if he has needed the nasal spray for seizures. Thanks

## 2019-07-09 NOTE — Telephone Encounter (Signed)
Left message informing pt that Dr. Delice Lesch recommended that he get the vaccine once available. Instructed pt to return the call and inform how he is feeling with increasing Lamotrigine and if he has had to use the nasal spray for his seizures.

## 2019-08-31 ENCOUNTER — Other Ambulatory Visit: Payer: Self-pay | Admitting: Neurology

## 2019-09-14 ENCOUNTER — Other Ambulatory Visit: Payer: Self-pay

## 2019-09-14 ENCOUNTER — Encounter: Payer: Self-pay | Admitting: Neurology

## 2019-09-14 ENCOUNTER — Ambulatory Visit (INDEPENDENT_AMBULATORY_CARE_PROVIDER_SITE_OTHER): Payer: 59 | Admitting: Neurology

## 2019-09-14 VITALS — BP 137/80 | HR 60 | Ht 78.0 in | Wt 292.6 lb

## 2019-09-14 DIAGNOSIS — G40009 Localization-related (focal) (partial) idiopathic epilepsy and epileptic syndromes with seizures of localized onset, not intractable, without status epilepticus: Secondary | ICD-10-CM

## 2019-09-14 MED ORDER — TROKENDI XR 200 MG PO CP24: ORAL_CAPSULE | ORAL | 3 refills | Status: DC

## 2019-09-14 MED ORDER — LAMOTRIGINE 200 MG PO TABS: 200.0000 mg | ORAL_TABLET | Freq: Two times a day (BID) | ORAL | 3 refills | Status: DC

## 2019-09-14 NOTE — Progress Notes (Signed)
NEUROLOGY FOLLOW UP OFFICE NOTE  Jay Cook [id number] April 05, 1971  HISTORY OF PRESENT ILLNESS: I had the pleasure of seeing Jay Cook in follow-up in the neurology clinic on 09/14/2019.  The patient was last seen 3 months ago for right temporal lobe epilepsy. He is alone in the office today. Since his last visit, Lamotrigine was increased to 245m BID. He is also on Trokendi XR 2036mqhs. He denies any nocturnal seizures since 05/04/2019. Since then, he has his CPAP machine and feels that a lot of the seizures were due to OSA. He denies any staring/unresponsive episodes, gaps in time, olfactory/gustatory hallucinations, myoclonic jerks. No headaches, dizziness, vision changes, no falls. Mood is okay, he is on Sertraline 15033maily. He reports weight gain since increasing the Lamotrigine, he exercises daily and has not changed his diet, no other new medications.   History On Initial Assessment 02/24/2019: This is a [age]ar old right-handed man with a history of diet-controlled hyperlipidemia, left Bell's palsy with no residual deficits, presenting for second opinion regarding seizures. The first seizure occurred early morning 05/04/2018, he states it was in sleep, however ER notes indicate his significant other witnessed him come out of the bathroom, sit on the bed, they lay down, then almost immediately make a grunting sound, both arms stiff and held above his hed, followed by convulsive activity causing him to fall off the bed. Seizure lasted less than 2 minutes, he was very confused and slightly combative after. In the ER, head CT without contrast was unremarkable, bloodwork overall unremarkable except for slightly elevated creatinine 1.7, UDS negative. He was evaluated by neurologist Dr. RunTrula Orefew days later, per notes MRI brain normal, EEG abnormal showing right temporal sharp and slow wave and mild diffuse slowing. He was started on Trokendi. On 10/15/2018 he woke up with slurred speech, fatigue,  dry mouth, and back pain. He went to the ER 3 days later for increased delusional thought, memory deficits, decreased sleep, and increasing agitation. He was also reporting tingling in his tongue and legs. Bloodwork unremarkable. Symptoms thought secondary to Trokendi, he reports being very stressed that time undergoing divorce proceedings. He was started on Sertraline and prn Xanax. He had an episode on 11/29/2018 where he was shaking and zoned out, unresponsive with diaphoresis, 911 was called but he did not go to the ER. He continued to report numbness and tingling in his tongue after the seizure. Repeat EEG showed right temporal slowing and sharp waves. Trokendi XR dose increased to 200m27mily. He was back in the ER on 02/01/2019 for a nocturnal seizure, his 9 ye67r old heard a noise and found him unresponsive and thrashing in bed, incontinent of urine with tongue bite. Lamotrigine was added, he is currently on an uptitration schedule increasing to 50mg32m today.   He denies any staring/unresponsive episodes, gaps in time, olfactory/gustatory hallucinations, deja vu, rising epigastric sensation, myoclonic jerks. His right arm feels numb every once in a while. He has also noticed occasional mild tremor in his right hand. He denies any headaches, dizziness, vision changes, bowel/bladder dysfunction. 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 RunheTrula Oreordered a sleep study. He has taken Xanax only 3 or 4 times since April. He occasional drinks alcohol on the weekends and may have had a drink or 2 prior to one of the seizures. He has a history of  left Bell's palsy 8-10 years ago which resolved after a month. He states his memory is okay, "I'm just getting old," he denies missing medications or bills, or getting lost driving (discussed that he should not be driving). Mood is pretty good.   Epilepsy Risk Factors:   He had a normal birth and early development.  There is no history of febrile convulsions, CNS infections such as meningitis/encephalitis, significant traumatic brain injury, neurosurgical procedures, or family history of seizures.  EEGs: MRI: I personally reviewed MRI Brain without contrast done 10/18/2018 which did not show any acute changes, hippocampi symmetric with no abnormal signal. 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  EEG done at Dr. Desiree Hane office in 05/2018 and 12/2018 showed right temporal slowing and sharp waves. EEG in 03/2019 was normal.   PAST MEDICAL HISTORY: Past Medical History:  Diagnosis Date  . Bell's palsy   . Mixed hyperlipidemia 06/28/2017  . Seizures ([place]) 05/2018   had 1 grand-mal seizure, placed on Trokendi XR    MEDICATIONS: Current Outpatient Medications on File Prior to Visit  Medication Sig Dispense Refill  . AMBULATORY NON FORMULARY MEDICATION Continuous positive airway pressure (CPAP) machine set on AutoPAP (4-20 cmH2O), with all supplemental supplies as needed. 1 each 0  . diazePAM (VALTOCO 10 MG DOSE) 10 MG/0.1ML LIQD Place 1 Dose into the nose as needed (for focal seizure lasting longer than 15 mins). Give 1 spray in each nostril (use one device per nostril). May give another dose at least 4 hours after first dose. 10 each 5  . lamoTRIgine (LAMICTAL) 200 MG tablet Take 1 tablet (200 mg total) by mouth 2 (two) times daily. 60 tablet 11  . sertraline (ZOLOFT) 100 MG tablet Take 150 mg by mouth at bedtime.     . Topiramate ER (TROKENDI XR) 200 MG CP24 Take 1 tablet every night 90 capsule 3  . diclofenac (VOLTAREN) 75 MG EC tablet Take 1 tablet (75 mg total) by mouth 2 (two) times daily. (Patient not taking: Reported on 09/14/2019) 60 tablet 3   No current facility-administered medications on file prior to visit.    ALLERGIES: No Known  Allergies  FAMILY HISTORY: Family History  Problem Relation Age of Onset  . Hyperlipidemia Father   . Diabetes Neg Hx   . Heart attack Neg Hx   . Hypertension Neg Hx   . Sudden death Neg Hx     SOCIAL HISTORY: Social History   Socioeconomic History  . Marital status: Married    Spouse name: Not on file  . Number of children: 2  . Years of education: Not on file  . Highest education level: Not on file  Occupational History  . Not on file  Tobacco Use  . Smoking status: Never Smoker  . Smokeless tobacco: Never Used  Substance and Sexual Activity  . Alcohol use: Yes    Alcohol/week: 12.0 standard drinks    Types: 12 Cans of beer per week  . Drug use: Never  . Sexual activity: Not on file  Other Topics Concern  . Not on file  Social History Narrative   Right handed      Highest level of edu- colleg      Lives alone- kids live with him 50% of the time due to seperation   Social Determinants of Health   Financial Resource Strain:   . Difficulty of Paying Living Expenses:   Food  Insecurity:   . Worried About Charity fundraiser in the Last Year:   . Arboriculturist in the Last Year:   Transportation Needs:   . Film/video editor (Medical):   Marland Kitchen Lack of Transportation (Non-Medical):   Physical Activity:   . Days of Exercise per Week:   . Minutes of Exercise per Session:   Stress:   . Feeling of Stress :   Social Connections:   . Frequency of Communication with Friends and Family:   . Frequency of Social Gatherings with Friends and Family:   . Attends Religious Services:   . Active Member of Clubs or Organizations:   . Attends Archivist Meetings:   Marland Kitchen Marital Status:   Intimate Partner Violence:   . Fear of Current or Ex-Partner:   . Emotionally Abused:   Marland Kitchen Physically Abused:   . Sexually Abused:     REVIEW OF SYSTEMS: Constitutional: No fevers, chills, or sweats, no generalized fatigue, change in appetite Eyes: No visual changes, double  vision, eye pain Ear, nose and throat: No hearing loss, ear pain, nasal congestion, sore throat Cardiovascular: No chest pain, palpitations Respiratory:  No shortness of breath at rest or with exertion, wheezes GastrointestinaI: No nausea, vomiting, diarrhea, abdominal pain, fecal incontinence Genitourinary:  No dysuria, urinary retention or frequency Musculoskeletal:  No neck pain, back pain Integumentary: No rash, pruritus, skin lesions Neurological: as above Psychiatric: No depression, insomnia, anxiety Endocrine: No palpitations, fatigue, diaphoresis, mood swings, change in appetite, change in weight, increased thirst Hematologic/Lymphatic:  No anemia, purpura, petechiae. Allergic/Immunologic: no itchy/runny eyes, nasal congestion, recent allergic reactions, rashes  PHYSICAL EXAM: Vitals:   09/14/19 1126  BP: 137/80  Pulse: 60  SpO2: 99%   General: No acute distress Head:  Normocephalic/atraumatic Skin/Extremities: No rash, no edema Neurological Exam: alert and oriented to person, place, and time. No aphasia or dysarthria. Fund of knowledge is appropriate.  Recent and remote memory are intact.  Attention and concentration are normal.   Cranial nerves: Pupils equal, round, reactive to light.  Extraocular movements intact with no nystagmus. Visual fields full. No facial asymmetry. Motor: Bulk and tone normal, muscle strength 5/5 throughout with no pronator drift. Finger to nose testing intact.  Gait narrow-based and steady, able to tandem walk adequately.    IMPRESSION: This is a 49 yo RH man with a history of diet-controlled hyperlipidemia, with right temporal lobe epilepsy. He has been doing well with increase in Lamotrigine to 275m BID, he is also on Trokendi XR 209mqhs. Last nocturnal seizure 05/04/2019. Improved sleep quality with CPAP has also helped significantly. He has not needed prn intranasal diazepam. He reports weight gain with Lamotrigine, we discussed options to reduce  dose back to 15062mID or switch to a different AED. We also discussed referral to Nutrition, since he exercises regularly. We have agreed to hold off for now and monitor weight. Weight today 292 lbs. We again discussed [place] driving laws, no driving until 6 months seizure-free. He will follow-up in 3-4 months and knows to call for any changes.    Thank you for allowing me to participate in his care.  Please do not hesitate to call for any questions or concerns.   KarEllouise Newer.D.   CC: Dr. TheDianah Field

## 2019-09-14 NOTE — Patient Instructions (Signed)
1. Continue current medications  2. Continue to monitor weight, we can consider seeing a Nutritionist in the future if necessary  3. Follow-up in 3-4 months, call for any changes.  Seizure Precautions: 1. If medication has been prescribed for you to prevent seizures, take it exactly as directed.  Do not stop taking the medicine without talking to your doctor first, even if you have not had a seizure in a long time.   2. Avoid activities in which a seizure would cause danger to yourself or to others.  Don't operate dangerous machinery, swim alone, or climb in high or dangerous places, such as on ladders, roofs, or girders.  Do not drive unless your doctor says you may.  3. If you have any warning that you may have a seizure, lay down in a safe place where you can't hurt yourself.    4.  No driving for 6 months from last seizure, as per [facility].   Please refer to the following link on the [place] website for more information: [email or web address]   5.  Maintain good sleep hygiene. Avoid alcohol.  6.  Contact your doctor if you have any problems that may be related to the medicine you are taking.  7.  Call 911 and bring the patient back to the ED if:        A.  The seizure lasts longer than 5 minutes.       B.  The patient doesn't awaken shortly after the seizure  C.  The patient has new problems such as difficulty seeing, speaking or moving  D.  The patient was injured during the seizure  E.  The patient has a temperature over 102 F (39C)  F.  The patient vomited and now is having trouble breathing

## 2019-12-18 ENCOUNTER — Encounter: Payer: Self-pay | Admitting: Sports Medicine

## 2019-12-18 ENCOUNTER — Ambulatory Visit (INDEPENDENT_AMBULATORY_CARE_PROVIDER_SITE_OTHER): Payer: 59 | Admitting: Sports Medicine

## 2019-12-18 ENCOUNTER — Other Ambulatory Visit: Payer: Self-pay | Admitting: Neurology

## 2019-12-18 ENCOUNTER — Other Ambulatory Visit: Payer: Self-pay

## 2019-12-18 VITALS — BP 134/92 | HR 81 | Temp 97.0°F | Ht 78.0 in | Wt 293.0 lb

## 2019-12-18 DIAGNOSIS — E669 Obesity, unspecified: Secondary | ICD-10-CM

## 2019-12-18 DIAGNOSIS — R569 Unspecified convulsions: Secondary | ICD-10-CM

## 2019-12-18 DIAGNOSIS — Z23 Encounter for immunization: Secondary | ICD-10-CM | POA: Diagnosis not present

## 2019-12-18 DIAGNOSIS — E291 Testicular hypofunction: Secondary | ICD-10-CM

## 2019-12-18 DIAGNOSIS — Z0000 Encounter for general adult medical examination without abnormal findings: Secondary | ICD-10-CM

## 2019-12-18 DIAGNOSIS — K644 Residual hemorrhoidal skin tags: Secondary | ICD-10-CM

## 2019-12-18 DIAGNOSIS — N139 Obstructive and reflux uropathy, unspecified: Secondary | ICD-10-CM

## 2019-12-18 MED ORDER — HYDROCORTISONE ACETATE 25 MG RE SUPP: 25.0000 mg | Freq: Two times a day (BID) | RECTAL | 2 refills | Status: DC | PRN

## 2019-12-18 MED ORDER — DOCUSATE SODIUM 100 MG PO CAPS: 100.0000 mg | ORAL_CAPSULE | Freq: Three times a day (TID) | ORAL | 3 refills | Status: DC | PRN

## 2019-12-18 NOTE — Progress Notes (Signed)
Subjective:    CC: Annual Physical Exam  HPI:  This patient is here for their annual physical  I reviewed the past medical history, family history, social history, surgical history, and allergies today and no changes were needed.  Please see the problem list section below in epic for further details.  Past Medical History: Past Medical History:  Diagnosis Date  . Bell's palsy   . Mixed hyperlipidemia 06/28/2017  . Seizures ([place]) 05/2018   had 1 grand-mal seizure, placed on Trokendi XR   Past Surgical History: Past Surgical History:  Procedure Laterality Date  . WRIST SURGERY     As a child   Social History: Social History   Socioeconomic History  . Marital status: Married    Spouse name: Not on file  . Number of children: 2  . Years of education: Not on file  . Highest education level: Not on file  Occupational History  . Not on file  Tobacco Use  . Smoking status: Never Smoker  . Smokeless tobacco: Never Used  Vaping Use  . Vaping Use: Never used  Substance and Sexual Activity  . Alcohol use: Yes    Alcohol/week: 12.0 standard drinks    Types: 12 Cans of beer per week  . Drug use: Never  . Sexual activity: Not on file  Other Topics Concern  . Not on file  Social History Narrative   Right handed      Highest level of edu- colleg      Lives alone- kids live with him 50% of the time due to seperation   Social Determinants of Health   Financial Resource Strain:   . Difficulty of Paying Living Expenses:   Food Insecurity:   . Worried About Charity fundraiser in the Last Year:   . Arboriculturist in the Last Year:   Transportation Needs:   . Film/video editor (Medical):   Marland Kitchen Lack of Transportation (Non-Medical):   Physical Activity:   . Days of Exercise per Week:   . Minutes of Exercise per Session:   Stress:   . Feeling of Stress :   Social Connections:   . Frequency of Communication with Friends and Family:   . Frequency of Social Gatherings  with Friends and Family:   . Attends Religious Services:   . Active Member of Clubs or Organizations:   . Attends Archivist Meetings:   Marland Kitchen Marital Status:    Family History: Family History  Problem Relation Age of Onset  . Hyperlipidemia Father   . Diabetes Neg Hx   . Heart attack Neg Hx   . Hypertension Neg Hx   . Sudden death Neg Hx    Allergies: No Known Allergies Medications: See med rec.  Review of Systems: No headache, visual changes, nausea, vomiting, diarrhea, constipation, dizziness, abdominal pain, skin rash, fevers, chills, night sweats, swollen lymph nodes, weight loss, chest pain, body aches, joint swelling, muscle aches, shortness of breath, mood changes, visual or auditory hallucinations.  Objective:    General: Well Developed, well nourished, and in no acute distress.  Neuro: Alert and oriented x3, extra-ocular muscles intact, sensation grossly intact. Cranial nerves II through XII are intact, motor, sensory, and coordinative functions are all intact. HEENT: Normocephalic, atraumatic, pupils equal round reactive to light, neck supple, no masses, no lymphadenopathy, thyroid nonpalpable. Oropharynx, nasopharynx, external ear canals are unremarkable. Skin: Warm and dry, no rashes noted.  Cardiac: Regular rate and rhythm, no murmurs rubs  or gallops.  Respiratory: Clear to auscultation bilaterally. Not using accessory muscles, speaking in full sentences.  Abdominal: Soft, nontender, nondistended, positive bowel sounds, no masses, no organomegaly.  Musculoskeletal: Shoulder, elbow, wrist, hip, knee, ankle stable, and with full range of motion.  Impression and Recommendations:    The patient was counselled, risk factors were discussed, anticipatory guidance given.  Annual physical exam Annual physical exam as above. Ordering routine labs. Return in 1 year for this.  Seizure ([place]) Overall seizures are well controlled on current medications. He is  well-established with neurology. No seizures over the past 8 months.  External hemorrhoid Larren does get external hemorrhoids, painful. He does not have an inflamed hemorrhoid today, we discussed prevention, treatment, and hemorrhoidectomy. My advice to him is we treated with which hazel, Anusol HC suppositories, Colace, and if the cosmetic appearance of the residual skin tags bothersome or if he has trouble cleaning after defecation it would be reasonable to excise them surgically. We would like to do a referral to [place] [facility] for this rather than do it in the office.  Obesity (BMI 30-39.9) Obadiah is interested in in losing weight, he has gained some weight over the pandemic. I would want to avoid amphetamine-based weight loss medications due to his seizure disorder, he could certainly look into [facility], I have asked him to contact us again in a month to see if we have some discount coupons.   ___________________________________________ Gwen Her. Dianah Field, M.D., ABFM., CAQSM. Primary Care and Sports Medicine [facility]  Adjunct Professor of [employer] [employer] [employer]

## 2019-12-18 NOTE — Assessment & Plan Note (Signed)
Obert is interested in in losing weight, he has gained some weight over the pandemic. I would want to avoid amphetamine-based weight loss medications due to his seizure disorder, he could certainly look into [facility], I have asked him to contact us again in a month to see if we have some discount coupons.

## 2019-12-18 NOTE — Assessment & Plan Note (Signed)
Overall seizures are well controlled on current medications. He is well-established with neurology. No seizures over the past 8 months.

## 2019-12-18 NOTE — Addendum Note (Signed)
Addended by: Rory Percy on: 12/18/2019 02:31 PM   Modules accepted: Orders

## 2019-12-18 NOTE — Assessment & Plan Note (Signed)
Kelley does get external hemorrhoids, painful. He does not have an inflamed hemorrhoid today, we discussed prevention, treatment, and hemorrhoidectomy. My advice to him is we treated with which hazel, Anusol HC suppositories, Colace, and if the cosmetic appearance of the residual skin tags bothersome or if he has trouble cleaning after defecation it would be reasonable to excise them surgically. We would like to do a referral to [place] [facility] for this rather than do it in the office.

## 2019-12-18 NOTE — Assessment & Plan Note (Signed)
Annual physical exam as above. Ordering routine labs. Return in 1 year for this.

## 2019-12-28 NOTE — Telephone Encounter (Signed)
Sent pt a mychart message to call to schedule an appointment. Thanks

## 2020-01-13 ENCOUNTER — Other Ambulatory Visit: Payer: Self-pay | Admitting: Neurology

## 2020-01-13 ENCOUNTER — Ambulatory Visit: Payer: 59 | Admitting: Neurology

## 2020-01-13 ENCOUNTER — Encounter: Payer: Self-pay | Admitting: Neurology

## 2020-01-13 ENCOUNTER — Other Ambulatory Visit: Payer: Self-pay

## 2020-01-13 VITALS — BP 147/69 | HR 84 | Ht 78.0 in | Wt 293.4 lb

## 2020-01-13 DIAGNOSIS — G40009 Localization-related (focal) (partial) idiopathic epilepsy and epileptic syndromes with seizures of localized onset, not intractable, without status epilepticus: Secondary | ICD-10-CM | POA: Diagnosis not present

## 2020-01-13 DIAGNOSIS — F419 Anxiety disorder, unspecified: Secondary | ICD-10-CM

## 2020-01-13 MED ORDER — TROKENDI XR 200 MG PO CP24: ORAL_CAPSULE | ORAL | 3 refills | Status: DC

## 2020-01-13 MED ORDER — SERTRALINE HCL 100 MG PO TABS: 150.0000 mg | ORAL_TABLET | Freq: Every day | ORAL | 3 refills | Status: DC

## 2020-01-13 MED ORDER — LAMOTRIGINE 200 MG PO TABS: 200.0000 mg | ORAL_TABLET | Freq: Two times a day (BID) | ORAL | 3 refills | Status: DC

## 2020-01-13 NOTE — Telephone Encounter (Signed)
Patient called in wanting a refill of his sertraline sent to his pharmacy

## 2020-01-13 NOTE — Telephone Encounter (Signed)
Order is pending until his appointment this afternoon

## 2020-01-13 NOTE — Patient Instructions (Signed)
Good to see you! Continue all your medications. Follow-up in 6 months, call for any changes.  Seizure Precautions: 1. If medication has been prescribed for you to prevent seizures, take it exactly as directed.  Do not stop taking the medicine without talking to your doctor first, even if you have not had a seizure in a long time.   2. Avoid activities in which a seizure would cause danger to yourself or to others.  Don't operate dangerous machinery, swim alone, or climb in high or dangerous places, such as on ladders, roofs, or girders.  Do not drive unless your doctor says you may.  3. If you have any warning that you may have a seizure, lay down in a safe place where you can't hurt yourself.    4.  No driving for 6 months from last seizure, as per [facility].   Please refer to the following link on the [place] website for more information: http://www.epilepsyfoundation.org/answerplace/Social/driving/drivingu.cfm   5.  Maintain good sleep hygiene. Avoid alcohol.  6.  Contact your doctor if you have any problems that may be related to the medicine you are taking.  7.  Call 911 and bring the patient back to the ED if:        A.  The seizure lasts longer than 5 minutes.       B.  The patient doesn't awaken shortly after the seizure  C.  The patient has new problems such as difficulty seeing, speaking or moving  D.  The patient was injured during the seizure  E.  The patient has a temperature over 102 F (39C)  F.  The patient vomited and now is having trouble breathing

## 2020-01-13 NOTE — Progress Notes (Signed)
NEUROLOGY FOLLOW UP OFFICE NOTE  Jay Cook [id number] [birth year]  HISTORY OF PRESENT ILLNESS: I had the pleasure of seeing Jay Cook in follow-up in the neurology clinic on 01/13/2020.  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 Since his last visit, he continues to do well with no nocturnal seizures since 05/2019. He denies any staring/unresponsive episodes, gaps in time, olfactory/gustatory hallucinations, focal numbness/tingling/weakness, myoclonic jerks. He denies any headaches, dizziness, diplopia, no falls. His hands are shaky sometimes, but does not affect daily activities. He is taking Lamotrigine 224m BID and Trokendi XR 2079mqhs without any issues. He is on Sertraline 15023maily, mood is good. Sleep is good with the CPAP machine.   History On Initial Assessment 02/24/2019: This is a [age]ar old right-handed man with a history of diet-controlled hyperlipidemia, left Bell's palsy with no residual deficits, presenting for second opinion regarding seizures. The first seizure occurred early morning 05/04/2018, he states it was in sleep, however ER notes indicate his significant other witnessed him come out of the bathroom, sit on the bed, they lay down, then almost immediately make a grunting sound, both arms stiff and held above his hed, followed by convulsive activity causing him to fall off the bed. Seizure lasted less than 2 minutes, he was very confused and slightly combative after. In the ER, head CT without contrast was unremarkable, bloodwork overall unremarkable except for slightly elevated creatinine 1.7, UDS negative. He was evaluated by neurologist Dr. RunTrula Orefew days later, per notes MRI brain normal, EEG abnormal showing right temporal sharp and slow wave and mild diffuse slowing. He was started on Trokendi. On 10/15/2018 he woke up with slurred speech, fatigue, dry mouth, and back pain. He went to the ER 3 days later for increased delusional  thought, memory deficits, decreased sleep, and increasing agitation. He was also reporting tingling in his tongue and legs. Bloodwork unremarkable. Symptoms thought secondary to Trokendi, he reports being very stressed that time undergoing divorce proceedings. He was started on Sertraline and prn Xanax. He had an episode on 11/29/2018 where he was shaking and zoned out, unresponsive with diaphoresis, 911 was called but he did not go to the ER. He continued to report numbness and tingling in his tongue after the seizure. Repeat EEG showed right temporal slowing and sharp waves. Trokendi XR dose increased to 200m63mily. He was back in the ER on 02/01/2019 for a nocturnal seizure, his 9 ye78r old heard a noise and found him unresponsive and thrashing in bed, incontinent of urine with tongue bite. Lamotrigine was added, he is currently on an uptitration schedule increasing to 50mg31m today.   He denies any staring/unresponsive episodes, gaps in time, olfactory/gustatory hallucinations, deja vu, rising epigastric sensation, myoclonic jerks. His right arm feels numb every once in a while. He has also noticed occasional mild tremor in his right hand. He denies any headaches, dizziness, vision changes, bowel/bladder dysfunction. 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 RunheTrula Oreordered a sleep study. He has taken Xanax only 3 or 4 times since April. He occasional drinks alcohol on the weekends and may have had a drink or 2 prior to one of the seizures. He has a history of left Bell's palsy 8-10 years ago which resolved after a month. He states his memory is okay, "I'm just getting old," he  denies missing medications or bills, or getting lost driving (discussed that he should not be driving). Mood is pretty good.   Epilepsy Risk Factors:  He had a normal birth and early development.  There is no history of febrile  convulsions, CNS infections such as meningitis/encephalitis, significant traumatic brain injury, neurosurgical procedures, or family history of seizures.  EEGs: MRI: I personally reviewed MRI Brain without contrast done 10/18/2018 which did not show any acute changes, hippocampi symmetric with no abnormal signal. 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  EEG done at Dr. Desiree Hane office in 05/2018 and 12/2018 showed right temporal slowing and sharp waves. EEG in 03/2019 was normal.   PAST MEDICAL HISTORY: Past Medical History:  Diagnosis Date  . Bell's palsy   . Mixed hyperlipidemia 06/28/2017  . Seizures ([place]) 05/2018   had 1 grand-mal seizure, placed on Trokendi XR    MEDICATIONS: Current Outpatient Medications on File Prior to Visit  Medication Sig Dispense Refill  . AMBULATORY NON FORMULARY MEDICATION Continuous positive airway pressure (CPAP) machine set on AutoPAP (4-20 cmH2O), with all supplemental supplies as needed. 1 each 0  . diazePAM (VALTOCO 10 MG DOSE) 10 MG/0.1ML LIQD Place 1 Dose into the nose as needed (for focal seizure lasting longer than 15 mins). Give 1 spray in each nostril (use one device per nostril). May give another dose at least 4 hours after first dose. 10 each 5  . docusate sodium (COLACE) 100 MG capsule Take 1 capsule (100 mg total) by mouth 3 (three) times daily as needed. 90 capsule 3  . hydrocortisone (ANUSOL-HC) 25 MG suppository Place 1 suppository (25 mg total) rectally 2 (two) times daily as needed for hemorrhoids. 24 suppository 2  . lamoTRIgine (LAMICTAL) 200 MG tablet Take 1 tablet (200 mg total) by mouth 2 (two) times daily. 90-day Rx 180 tablet 3  . sertraline (ZOLOFT) 100 MG tablet Take 150 mg by mouth at bedtime.     . Topiramate ER (TROKENDI XR) 200 MG CP24 Take 1 tablet every night 90 capsule 3   No current facility-administered medications  on file prior to visit.    ALLERGIES: No Known Allergies  FAMILY HISTORY: Family History  Problem Relation Age of Onset  . Hyperlipidemia Father   . Diabetes Neg Hx   . Heart attack Neg Hx   . Hypertension Neg Hx   . Sudden death Neg Hx     SOCIAL HISTORY: Social History   Socioeconomic History  . Marital status: Married    Spouse name: Not on file  . Number of children: 2  . Years of education: Not on file  . Highest education level: Not on file  Occupational History  . Not on file  Tobacco Use  . Smoking status: Never Smoker  . Smokeless tobacco: Never Used  Vaping Use  . Vaping Use: Never used  Substance and Sexual Activity  . Alcohol use: Yes    Alcohol/week: 12.0 standard drinks    Types: 12 Cans of beer per week  . Drug use: Never  . Sexual activity: Not on file  Other Topics Concern  . Not on file  Social History Narrative   Right handed      Highest level of edu- colleg      Lives alone- kids live with him 50% of the time due to seperation   Social Determinants of Health   Financial Resource Strain:   .  Difficulty of Paying Living Expenses:   Food Insecurity:   . Worried About Charity fundraiser in the Last Year:   . Arboriculturist in the Last Year:   Transportation Needs:   . Film/video editor (Medical):   Marland Kitchen Lack of Transportation (Non-Medical):   Physical Activity:   . Days of Exercise per Week:   . Minutes of Exercise per Session:   Stress:   . Feeling of Stress :   Social Connections:   . Frequency of Communication with Friends and Family:   . Frequency of Social Gatherings with Friends and Family:   . Attends Religious Services:   . Active Member of Clubs or Organizations:   . Attends Archivist Meetings:   Marland Kitchen Marital Status:   Intimate Partner Violence:   . Fear of Current or Ex-Partner:   . Emotionally Abused:   Marland Kitchen Physically Abused:   . Sexually Abused:     PHYSICAL EXAM: Vitals:   01/13/20 1455  BP: (!)  147/69  Pulse: 84  SpO2: 96%   General: No acute distress Head:  Normocephalic/atraumatic Skin/Extremities: No rash, no edema Neurological Exam: alert and oriented to person, place, and time. No aphasia or dysarthria. Fund of knowledge is appropriate.  Recent and remote memory are intact.  Attention and concentration are normal.  Cranial nerves: Pupils equal, round, reactive to light.  Extraocular movements intact with no nystagmus. Visual fields full. No facial asymmetry. Motor: Bulk and tone normal, muscle strength 5/5 throughout with no pronator drift.  Finger to nose testing intact.  Gait narrow-based and steady, able to tandem walk adequately.  Romberg negative. Minimal endpoint tremor bilaterally.  IMPRESSION: This is a 49 yo RH man with a history of diet-controlled hyperlipidemia, with right temporal lobe epilepsy. He has been seizure-free since 05/2019 on Lamotrigine 266m BID and Trokendi XR 2044mqhs. Mood good on Sertraline 15045maily. Refills sent. He is aware of [place] driving laws to stop driving after a seizure until 6 months seizure-free. Follow-up in 6 months, he knows to call for any changes.    Thank you for allowing me to participate in his care.  Please do not hesitate to call for any questions or concerns.   KarEllouise Newer.D.   CC: Dr. TheDianah Field

## 2020-06-12 ENCOUNTER — Other Ambulatory Visit: Payer: Self-pay | Admitting: Neurology

## 2020-06-13 NOTE — Telephone Encounter (Signed)
What do we need for this, looks like PA is being asked? Thanks

## 2020-06-16 NOTE — Telephone Encounter (Signed)
Can I close this, anything I need to do? Thanks

## 2020-06-20 ENCOUNTER — Encounter: Payer: Self-pay | Admitting: Neurology

## 2020-06-20 NOTE — Progress Notes (Addendum)
Trokendi denied through Cover My Meds. Faxed appeal to [facility] on 06/10/20. Called on 12/20 to check status and was told it was still pending; deadline for decision is 07/14/20. Case ID#: [id number].  1/14- called to check status and Rep said that it was approved starting 07/09/20. She was sending the Appeal Approval letter via fax. Will scan to chart.   Approval valid from 07/09/20 to 07/09/21.

## 2020-06-23 ENCOUNTER — Telehealth: Payer: Self-pay

## 2020-06-23 NOTE — Telephone Encounter (Signed)
Patient called and said his trokendi needs a prior authorization. He is just about out of the medication.

## 2020-06-23 NOTE — Telephone Encounter (Signed)
Samples are up front for pt to pick up. We are working on pt PA

## 2020-06-23 NOTE — Telephone Encounter (Signed)
Pt called in stated that his insurance stated he picked up his trokendi on 06/12/20 pt stated last time he got his medication filled was October. [facility] is at [facility] at this time and going to get them to fax his information to insurance about which medications he has had filled and when, pt was informed that in samples and co-pay card is up front he said he will come by next week to pick it up. Pt was also advised that we have been working on his PA and that it is pending and that we will call him when we know the results,

## 2020-06-23 NOTE — Telephone Encounter (Signed)
Patient's PA Appeal is currently still pending. Awaiting decision from insurance. Appeal process is set to end on or before 07/14/20.

## 2020-06-23 NOTE — Telephone Encounter (Signed)
Samples of this drug Trokendi XR 228m were given to the patient, quantity 4 box , Lot Number [id number] 07/01/2021. Pt also given co-pay card.  Voice mail left for pt stating that we are working on his PA and that samples are up front for him to come by to pick up

## 2020-07-04 IMAGING — CT CT HEAD WITHOUT CONTRAST
3 series · 15 of 47 positions shown, 18 images · non-contrast
Comparison: MR brain 05/06/2018

CLINICAL DATA: Altered level of consciousness

EXAM:
CT HEAD WITHOUT CONTRAST
TECHNIQUE: Contiguous axial images were obtained from the base of the skull
through the vertex without intravenous contrast.

[Series 3: head 5.0 h30s · axial · 0.48mm/px · z∈[-85,+65]mm · 9 of 36 slices shown, 12 images]
[im 3/36  brain]
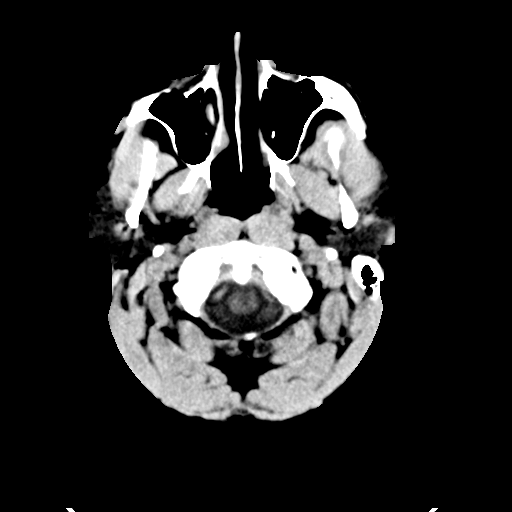
[im 3/36  bone]
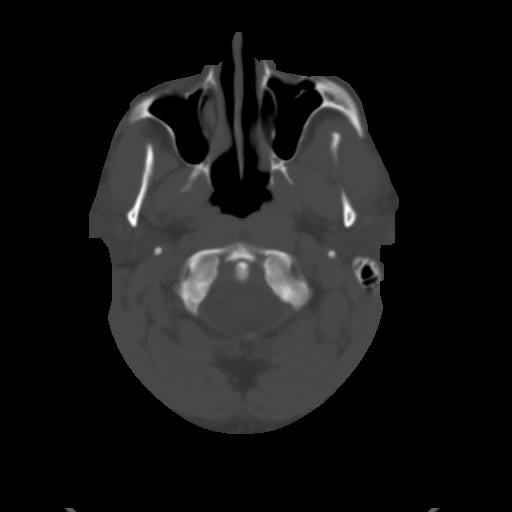
[im 7/36  brain]
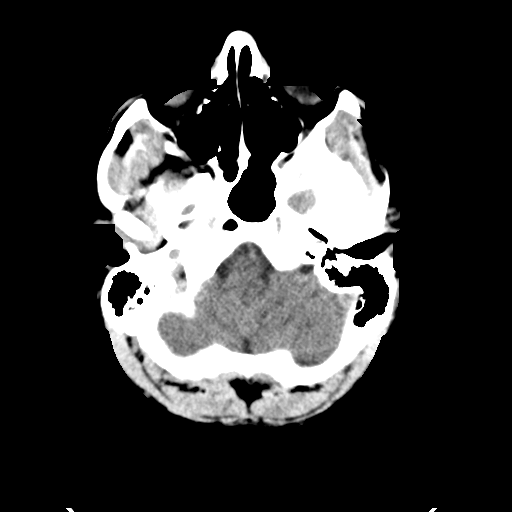
[im 10/36  brain]
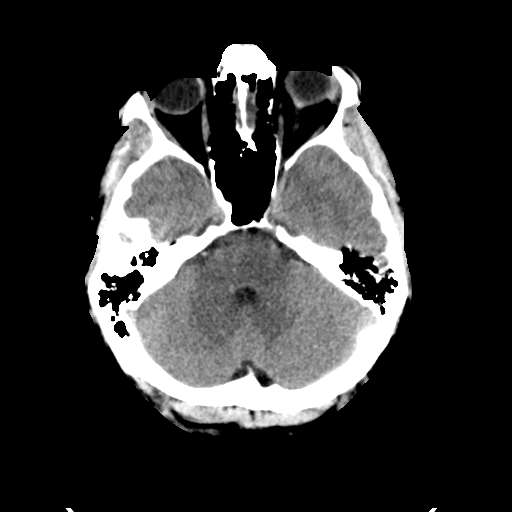
[im 14/36  brain]
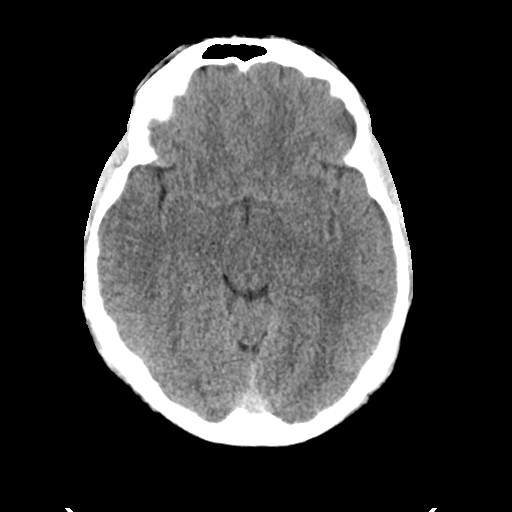
[im 19/36  brain]
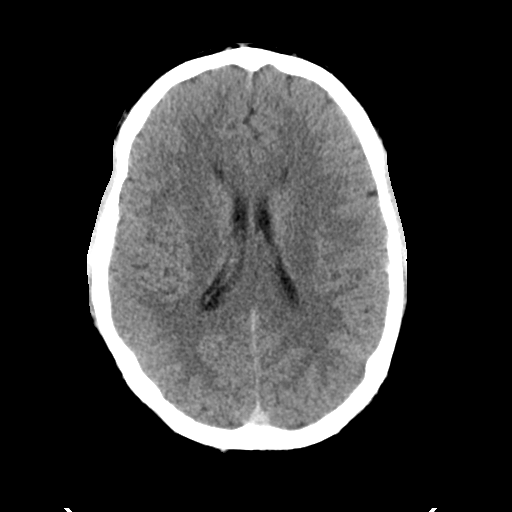
[im 19/36  bone]
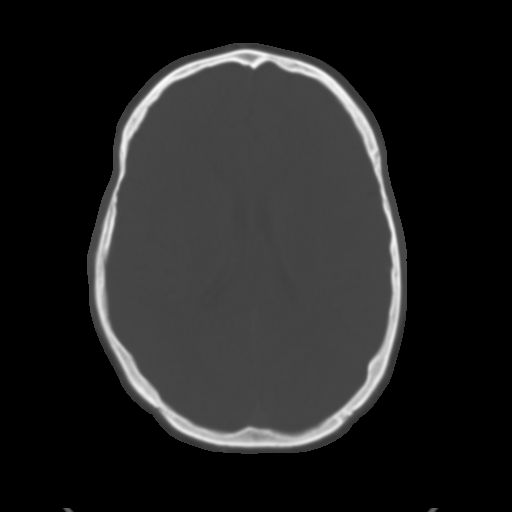
[im 22/36  brain]
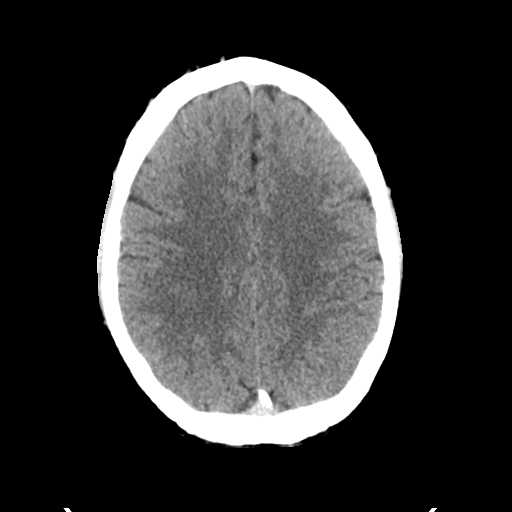
[im 26/36  brain]
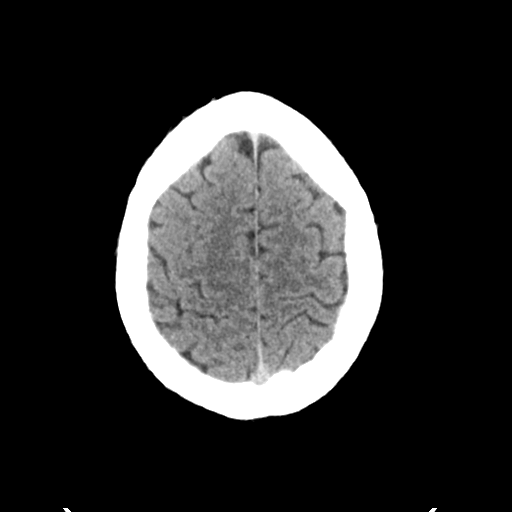
[im 29/36  brain]
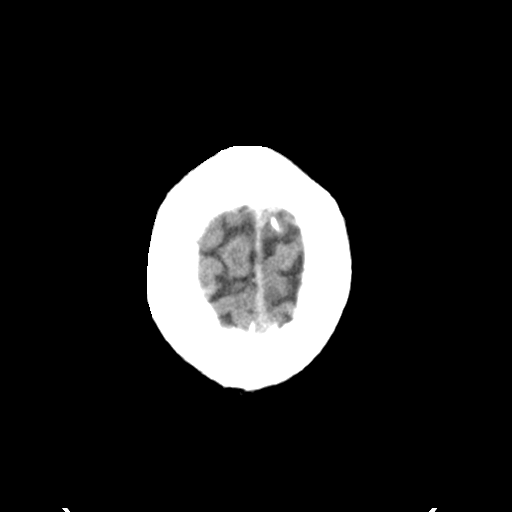
[im 33/36  brain]
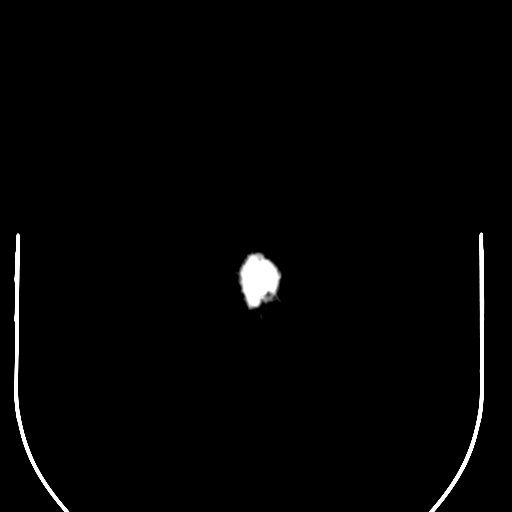
[im 33/36  bone]
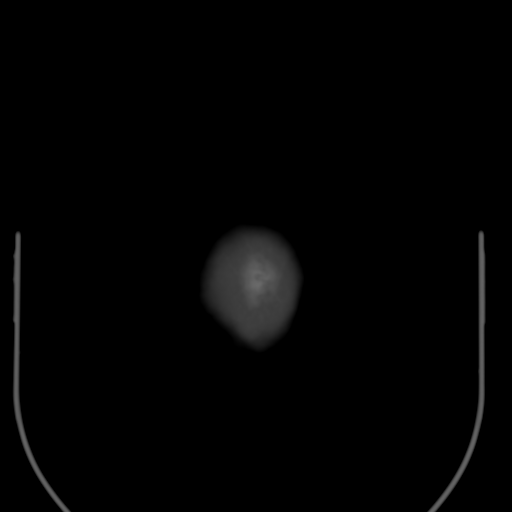

[Series 5: head 3.0 mpr cor · coronal · 0.36mm/px · 3 of 75 slices shown]
[im 25/75  brain]
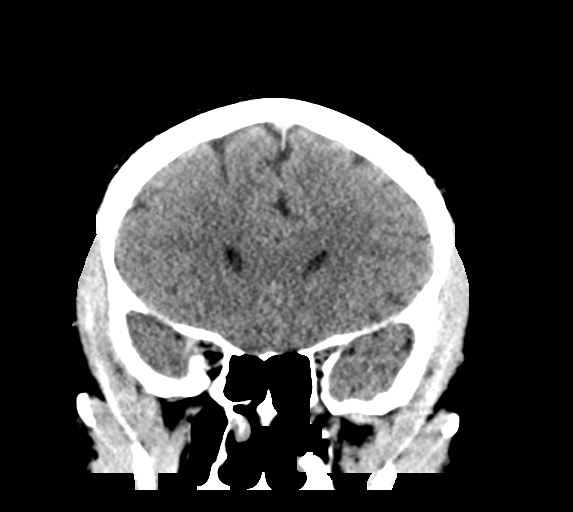
[im 33/75  brain]
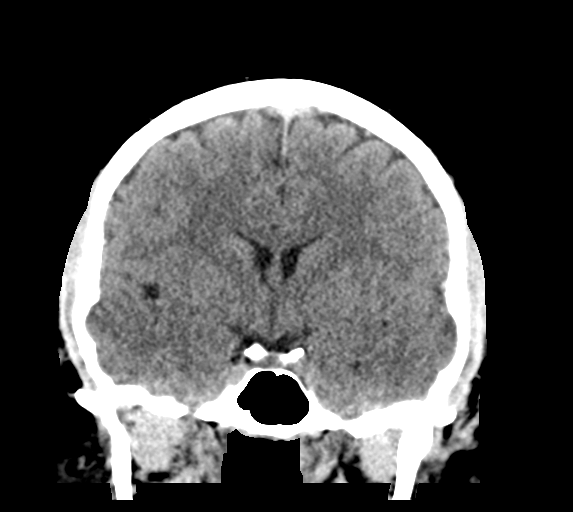
[im 42/75  brain]
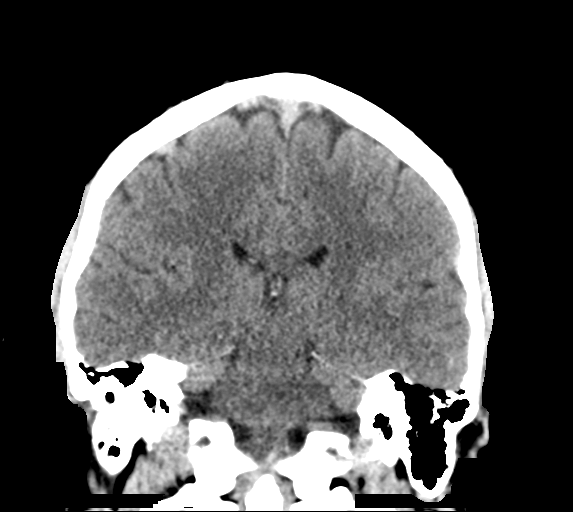

[Series 6: head 3.0 mpr sag · sagittal · 0.37mm/px · 3 of 67 slices shown]
[im 23/67  brain]
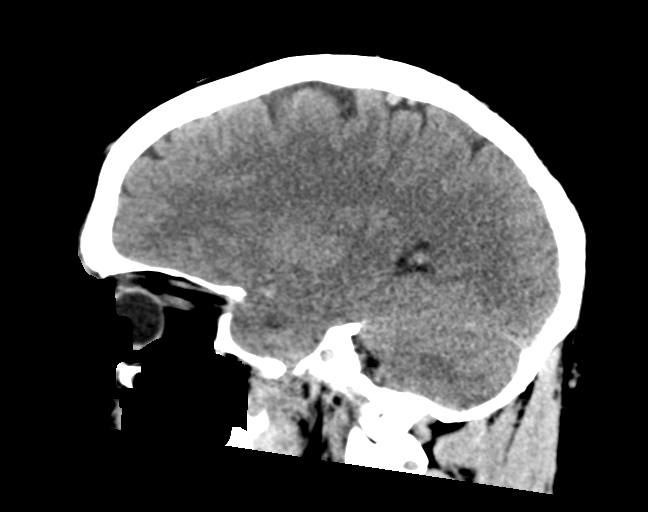
[im 34/67  brain]
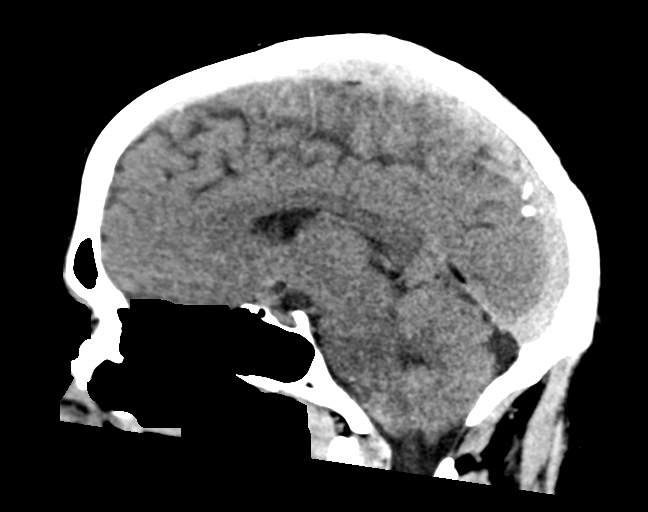
[im 45/67  brain]
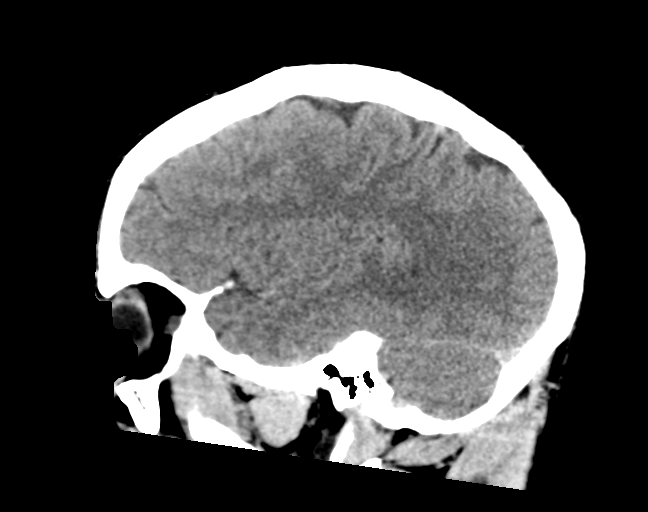

[15 of 47 positions shown; findings below may reference images not displayed]

FINDINGS: Brain: No evidence of acute infarction, hemorrhage, hydrocephalus,
extra-axial collection or mass lesion/mass effect. Cerebellar
tonsils extend into the foramen magnum.

Vascular: No hyperdense vessel or unexpected calcification.

Skull: No osseous abnormality.

Sinuses/Orbits: Visualized paranasal sinuses are clear. Visualized
mastoid sinuses are clear. Visualized orbits demonstrate no focal
abnormality.

Other: None
IMPRESSION: 1. No acute intracranial pathology.
2. Cerebellar ectopia.

## 2020-07-06 NOTE — Telephone Encounter (Signed)
No, currently still pending until 1/13 or before. But I just called them and it is still pending.

## 2020-07-06 NOTE — Telephone Encounter (Signed)
I'm not sure because his message was a little confusing. Is he wanting to try something else instead of the Trokendi? Or like is he just wanting something in the mean time until we hear back from insurance about the Trokendi appeal?

## 2020-07-06 NOTE — Telephone Encounter (Signed)
Chelsea Any word on the PA?   Dr Delice Lesch Looks like pharmacy is asking for a script?

## 2020-07-06 NOTE — Telephone Encounter (Signed)
Do I approve or refuse the prescription? Just let it be? Pls let me know thanks

## 2020-07-08 ENCOUNTER — Telehealth: Payer: Self-pay | Admitting: Neurology

## 2020-07-08 ENCOUNTER — Other Ambulatory Visit: Payer: Self-pay | Admitting: Neurology

## 2020-07-08 NOTE — Telephone Encounter (Signed)
Patient called in stating he is out of his Trokendi and was told he could pick up a 30 day supply in the office until his insurance finishes processing the appeal.

## 2020-07-08 NOTE — Telephone Encounter (Signed)
Trokendi needed, patient is about out of the medication.  [facility] [address]

## 2020-07-08 NOTE — Telephone Encounter (Signed)
Samples given.  

## 2020-07-08 NOTE — Telephone Encounter (Signed)
Pls have him come by the office for samples of Trokendi XR 256m daily. Ok to give 2 weeks worth until we hear about PA approval, thanks

## 2020-08-11 ENCOUNTER — Other Ambulatory Visit: Payer: Self-pay

## 2020-08-11 ENCOUNTER — Ambulatory Visit: Payer: 59 | Admitting: Neurology

## 2020-08-11 ENCOUNTER — Encounter: Payer: Self-pay | Admitting: Neurology

## 2020-08-11 VITALS — BP 126/73 | HR 66 | Ht 78.0 in | Wt 294.4 lb

## 2020-08-11 DIAGNOSIS — G40009 Localization-related (focal) (partial) idiopathic epilepsy and epileptic syndromes with seizures of localized onset, not intractable, without status epilepticus: Secondary | ICD-10-CM | POA: Diagnosis not present

## 2020-08-11 DIAGNOSIS — F419 Anxiety disorder, unspecified: Secondary | ICD-10-CM

## 2020-08-11 MED ORDER — SERTRALINE HCL 100 MG PO TABS: 150.0000 mg | ORAL_TABLET | Freq: Every day | ORAL | 3 refills | Status: DC

## 2020-08-11 MED ORDER — LAMOTRIGINE 200 MG PO TABS: 200.0000 mg | ORAL_TABLET | Freq: Two times a day (BID) | ORAL | 3 refills | Status: DC

## 2020-08-11 MED ORDER — TROKENDI XR 200 MG PO CP24: ORAL_CAPSULE | ORAL | 1 refills | Status: DC

## 2020-08-11 NOTE — Patient Instructions (Signed)
Good to see you. Continue all your medications, refills have been sent. Follow-up in 1 year, call for any changes.   Seizure Precautions: 1. If medication has been prescribed for you to prevent seizures, take it exactly as directed.  Do not stop taking the medicine without talking to your doctor first, even if you have not had a seizure in a long time.   2. Avoid activities in which a seizure would cause danger to yourself or to others.  Don't operate dangerous machinery, swim alone, or climb in high or dangerous places, such as on ladders, roofs, or girders.  Do not drive unless your doctor says you may.  3. If you have any warning that you may have a seizure, lay down in a safe place where you can't hurt yourself.    4.  No driving for 6 months from last seizure, as per [facility].   Please refer to the following link on the [place] website for more information: http://www.epilepsyfoundation.org/answerplace/Social/driving/drivingu.cfm   5.  Maintain good sleep hygiene. Avoid alcohol.  6.  Contact your doctor if you have any problems that may be related to the medicine you are taking.  7.  Call 911 and bring the patient back to the ED if:        A.  The seizure lasts longer than 5 minutes.       B.  The patient doesn't awaken shortly after the seizure  C.  The patient has new problems such as difficulty seeing, speaking or moving  D.  The patient was injured during the seizure  E.  The patient has a temperature over 102 F (39C)  F.  The patient vomited and now is having trouble breathing

## 2020-08-11 NOTE — Progress Notes (Signed)
NEUROLOGY FOLLOW UP OFFICE NOTE  Jay Cook [id number] 11/19/1970  HISTORY OF PRESENT ILLNESS: I had the pleasure of seeing Jay Cook in follow-up in the neurology clinic on 08/11/2020.  The patient was last seen 7 months ago for right temporal lobe epilepsy. He is alone in the office today. Since his last visit, he continues to deny any nocturnal seizures since 05/2019. He denies any staring/unresponsive episodes, gaps in time, olfactory/gustatory hallucinations, focal numbness/tingling/weakness, myoclonic jerks. His girlfriend reports hypnic jerks when he is asleep. He denies any headaches, dizziness, vision changes, no falls. Sleep is good with his CPAP machine, mood is good. He is on Lamotrigine 226m BID, Trokendi XR 2032mqhs, and Sertraline 15046maily without side effects.    History On Initial Assessment 02/24/2019: This is a [age]ar old right-handed man with a history of diet-controlled hyperlipidemia, left Bell's palsy with no residual deficits, presenting for second opinion regarding seizures. The first seizure occurred early morning 05/04/2018, he states it was in sleep, however ER notes indicate his significant other witnessed him come out of the bathroom, sit on the bed, they lay down, then almost immediately make a grunting sound, both arms stiff and held above his hed, followed by convulsive activity causing him to fall off the bed. Seizure lasted less than 2 minutes, he was very confused and slightly combative after. In the ER, head CT without contrast was unremarkable, bloodwork overall unremarkable except for slightly elevated creatinine 1.7, UDS negative. He was evaluated by neurologist Dr. RunTrula Orefew days later, per notes MRI brain normal, EEG abnormal showing right temporal sharp and slow wave and mild diffuse slowing. He was started on Trokendi. On 10/15/2018 he woke up with slurred speech, fatigue, dry mouth, and back pain. He went to the ER 3 days later for increased  delusional thought, memory deficits, decreased sleep, and increasing agitation. He was also reporting tingling in his tongue and legs. Bloodwork unremarkable. Symptoms thought secondary to Trokendi, he reports being very stressed that time undergoing divorce proceedings. He was started on Sertraline and prn Xanax. He had an episode on 11/29/2018 where he was shaking and zoned out, unresponsive with diaphoresis, 911 was called but he did not go to the ER. He continued to report numbness and tingling in his tongue after the seizure. Repeat EEG showed right temporal slowing and sharp waves. Trokendi XR dose increased to 200m20mily. He was back in the ER on 02/01/2019 for a nocturnal seizure, his 9 ye10r old heard a noise and found him unresponsive and thrashing in bed, incontinent of urine with tongue bite. Lamotrigine was added, he is currently on an uptitration schedule increasing to 50mg5m today.   He denies any staring/unresponsive episodes, gaps in time, olfactory/gustatory hallucinations, deja vu, rising epigastric sensation, myoclonic jerks. His right arm feels numb every once in a while. He has also noticed occasional mild tremor in his right hand. He denies any headaches, dizziness, vision changes, bowel/bladder dysfunction. 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 RunheTrula Oreordered a sleep study. He has taken Xanax only 3 or 4 times since April. He occasional drinks alcohol on the weekends and may have had a drink or 2 prior to one of the seizures. He has a history of left Bell's palsy 8-10 years ago which resolved after a month. He states his memory is okay, "I'm just getting  old," he denies missing medications or bills, or getting lost driving (discussed that he should not be driving). Mood is pretty good.   Epilepsy Risk Factors:  He had a normal birth and early development.  There is no history of  febrile convulsions, CNS infections such as meningitis/encephalitis, significant traumatic brain injury, neurosurgical procedures, or family history of seizures.  EEGs: MRI: I personally reviewed MRI Brain without contrast done 10/18/2018 which did not show any acute changes, hippocampi symmetric with no abnormal signal. 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  EEG done at Dr. Desiree Hane office in 05/2018 and 12/2018 showed right temporal slowing and sharp waves. EEG in 03/2019 was normal.   PAST MEDICAL HISTORY: Past Medical History:  Diagnosis Date  . Bell's palsy   . Mixed hyperlipidemia 06/28/2017  . Seizures ([place]) 05/2018   had 1 grand-mal seizure, placed on Trokendi XR    MEDICATIONS: Current Outpatient Medications on File Prior to Visit  Medication Sig Dispense Refill  . AMBULATORY NON FORMULARY MEDICATION Continuous positive airway pressure (CPAP) machine set on AutoPAP (4-20 cmH2O), with all supplemental supplies as needed. 1 each 0  . diazePAM (VALTOCO 10 MG DOSE) 10 MG/0.1ML LIQD Place 1 Dose into the nose as needed (for focal seizure lasting longer than 15 mins). Give 1 spray in each nostril (use one device per nostril). May give another dose at least 4 hours after first dose. 10 each 5  . lamoTRIgine (LAMICTAL) 200 MG tablet Take 1 tablet (200 mg total) by mouth 2 (two) times daily. 90-day Rx 180 tablet 3  . sertraline (ZOLOFT) 100 MG tablet Take 1.5 tablets (150 mg total) by mouth at bedtime. 135 tablet 3  . Topiramate ER (TROKENDI XR) 200 MG CP24 TAKE 1 CAPSULE BY MOUTH EVERY DAY AT NIGHT 90 capsule 1   No current facility-administered medications on file prior to visit.    ALLERGIES: No Known Allergies  FAMILY HISTORY: Family History  Problem Relation Age of Onset  . Hyperlipidemia Father   . Diabetes Neg Hx   . Heart attack Neg Hx   . Hypertension Neg Hx   .  Sudden death Neg Hx     SOCIAL HISTORY: Social History   Socioeconomic History  . Marital status: Married    Spouse name: Not on file  . Number of children: 2  . Years of education: Not on file  . Highest education level: Not on file  Occupational History  . Not on file  Tobacco Use  . Smoking status: Never Smoker  . Smokeless tobacco: Never Used  Vaping Use  . Vaping Use: Never used  Substance and Sexual Activity  . Alcohol use: Yes    Alcohol/week: 12.0 standard drinks    Types: 12 Cans of beer per week  . Drug use: Never  . Sexual activity: Not on file  Other Topics Concern  . Not on file  Social History Narrative   Right handed      Highest level of edu- colleg      Lives alone- kids live with him 50% of the time due to seperation   Social Determinants of Health   Financial Resource Strain: Not on file  Food Insecurity: Not on file  Transportation Needs: Not on file  Physical Activity: Not on file  Stress: Not on file  Social Connections: Not on file  Intimate Partner Violence: Not on file  PHYSICAL EXAM: Vitals:   08/11/20 1221  BP: 126/73  Pulse: 66  SpO2: 98%   General: No acute distress Head:  Normocephalic/atraumatic Skin/Extremities: No rash, no edema Neurological Exam: alert and awake. No aphasia or dysarthria. Fund of knowledge is appropriate. Attention and concentration are normal.   Cranial nerves: Pupils equal, round. Extraocular movements intact with no nystagmus. Visual fields full.  No facial asymmetry.  Motor: Bulk and tone normal, muscle strength 5/5 throughout with no pronator drift.   Finger to nose testing intact.  Gait narrow-based and steady, able to tandem walk adequately.  Romberg negative.   IMPRESSION: This is a 50 yo RH man with a history of diet-controlled hyperlipidemia, with right temporal lobe epilepsy. He continues to be seizure-free since 05/2019 on Lamotrigine 26m BID and Trokendi XR 2053mqhs. Mood good on  Sertraline 15020mhs. Refills sent. He is aware of [place] driving laws to stop driving after a seizure until 6 months seizure-free. Follow-up in 1 year, he knows to call for any changes.   Thank you for allowing me to participate in his care.  Please do not hesitate to call for any questions or concerns.   KarEllouise Newer.D.   CC: Dr. TheDianah Field

## 2020-09-01 ENCOUNTER — Telehealth: Payer: Self-pay | Admitting: Neurology

## 2020-09-01 NOTE — Telephone Encounter (Signed)
Patient called in stating is Urologist wants him to start taking Anastrozole. He wants to make sure this will not interfere with his seizure medication before he starts taking it.

## 2020-09-01 NOTE — Telephone Encounter (Signed)
Pls let him know there are no reported interactions with his medications. Thanks

## 2020-09-02 NOTE — Telephone Encounter (Signed)
Pt called no answer left a voice mail stating no reported interactions with his medications any questions please call the office back

## 2020-12-19 ENCOUNTER — Ambulatory Visit (INDEPENDENT_AMBULATORY_CARE_PROVIDER_SITE_OTHER): Payer: 59 | Admitting: Sports Medicine

## 2020-12-19 ENCOUNTER — Other Ambulatory Visit: Payer: Self-pay

## 2020-12-19 VITALS — BP 117/76 | HR 68 | Ht 78.0 in | Wt 295.0 lb

## 2020-12-19 DIAGNOSIS — Z1211 Encounter for screening for malignant neoplasm of colon: Secondary | ICD-10-CM

## 2020-12-19 DIAGNOSIS — F39 Unspecified mood [affective] disorder: Secondary | ICD-10-CM | POA: Diagnosis not present

## 2020-12-19 DIAGNOSIS — R030 Elevated blood-pressure reading, without diagnosis of hypertension: Secondary | ICD-10-CM

## 2020-12-19 DIAGNOSIS — N139 Obstructive and reflux uropathy, unspecified: Secondary | ICD-10-CM | POA: Diagnosis not present

## 2020-12-19 DIAGNOSIS — Z0000 Encounter for general adult medical examination without abnormal findings: Secondary | ICD-10-CM

## 2020-12-19 DIAGNOSIS — R569 Unspecified convulsions: Secondary | ICD-10-CM | POA: Diagnosis not present

## 2020-12-19 DIAGNOSIS — E782 Mixed hyperlipidemia: Secondary | ICD-10-CM

## 2020-12-19 NOTE — Assessment & Plan Note (Signed)
Stable and well-controlled on current dose of sertraline, no changes.

## 2020-12-19 NOTE — Progress Notes (Addendum)
Subjective:    CC: Annual Physical Exam  HPI:  This patient is here for their annual physical  I reviewed the past medical history, family history, social history, surgical history, and allergies today and no changes were needed.  Please see the problem list section below in epic for further details.  Past Medical History: Past Medical History:  Diagnosis Date   Bell's palsy    Mixed hyperlipidemia 06/28/2017   Seizures ([place]) 05/2018   had 1 grand-mal seizure, placed on Trokendi XR   Past Surgical History: Past Surgical History:  Procedure Laterality Date   WRIST SURGERY     As a child   Social History: Social History   Socioeconomic History   Marital status: Married    Spouse name: Not on file   Number of children: 2   Years of education: Not on file   Highest education level: Not on file  Occupational History   Not on file  Tobacco Use   Smoking status: Never   Smokeless tobacco: Never  Vaping Use   Vaping Use: Never used  Substance and Sexual Activity   Alcohol use: Yes    Alcohol/week: 12.0 standard drinks    Types: 12 Cans of beer per week   Drug use: Never   Sexual activity: Not on file  Other Topics Concern   Not on file  Social History Narrative   Right handed      Highest level of edu- colleg      Lives alone- kids live with him 50% of the time due to seperation   Social Determinants of Health   Financial Resource Strain: Not on file  Food Insecurity: Not on file  Transportation Needs: Not on file  Physical Activity: Not on file  Stress: Not on file  Social Connections: Not on file   Family History: Family History  Problem Relation Age of Onset   Hyperlipidemia Father    Diabetes Neg Hx    Heart attack Neg Hx    Hypertension Neg Hx    Sudden death Neg Hx    Allergies: No Known Allergies Medications: See med rec.  Review of Systems: No headache, visual changes, nausea, vomiting, diarrhea, constipation, dizziness, abdominal pain,  skin rash, fevers, chills, night sweats, swollen lymph nodes, weight loss, chest pain, body aches, joint swelling, muscle aches, shortness of breath, mood changes, visual or auditory hallucinations.  Objective:    General: Well Developed, well nourished, and in no acute distress.  Neuro: Alert and oriented x3, extra-ocular muscles intact, sensation grossly intact. Cranial nerves II through XII are intact, motor, sensory, and coordinative functions are all intact. HEENT: Normocephalic, atraumatic, pupils equal round reactive to light, neck supple, no masses, no lymphadenopathy, thyroid nonpalpable. Oropharynx, nasopharynx, external ear canals are unremarkable. Skin: Warm and dry, no rashes noted.  Cardiac: Regular rate and rhythm, no murmurs rubs or gallops.  Respiratory: Clear to auscultation bilaterally. Not using accessory muscles, speaking in full sentences.  Abdominal: Soft, nontender, nondistended, positive bowel sounds, no masses, no organomegaly.  Musculoskeletal: Shoulder, elbow, wrist, hip, knee, ankle stable, and with full range of motion.  Impression and Recommendations:    The patient was counselled, risk factors were discussed, anticipatory guidance given.  Annual physical exam Annual physical as above, checking routine labs. Up-to-date on screening measures, we do need to add Cologuard.  Mood disorder (HCC) Stable and well-controlled on current dose of sertraline, no changes.  Seizure ([place]) Has continued to be seizure-free over the last year,  continue Lamictal and Trokendi. Continues follow-up with neurology.  Mixed hyperlipidemia Lipids are markedly high, awaiting patient approval to start rosuvastatin 20.   ___________________________________________ Gwen Her. Dianah Field, M.D., ABFM., CAQSM. Primary Care and Sports Medicine [facility]  Adjunct Professor of [place] [facility] [employer] [facility] [employer]

## 2020-12-19 NOTE — Assessment & Plan Note (Signed)
Has continued to be seizure-free over the last year, continue Lamictal and Trokendi. Continues follow-up with neurology.

## 2020-12-19 NOTE — Assessment & Plan Note (Signed)
Annual physical as above, checking routine labs. Up-to-date on screening measures, we do need to add Cologuard.

## 2020-12-19 NOTE — Assessment & Plan Note (Signed)
Lipids are markedly high, awaiting patient approval to start rosuvastatin 20.

## 2020-12-20 ENCOUNTER — Encounter (INDEPENDENT_AMBULATORY_CARE_PROVIDER_SITE_OTHER): Payer: 59

## 2020-12-20 DIAGNOSIS — E782 Mixed hyperlipidemia: Secondary | ICD-10-CM | POA: Diagnosis not present

## 2020-12-20 LAB — CBC
HCT: 44.8 % (ref 38.5–50.0)
Hemoglobin: 15.3 g/dL (ref 13.2–17.1)
MCH: 29.5 pg (ref 27.0–33.0)
MCHC: 34.2 g/dL (ref 32.0–36.0)
MCV: 86.5 fL (ref 80.0–100.0)
MPV: 12.4 fL (ref 7.5–12.5)
Platelets: 179 10*3/uL (ref 140–400)
RBC: 5.18 10*6/uL (ref 4.20–5.80)
RDW: 13 % (ref 11.0–15.0)
WBC: 4.6 10*3/uL (ref 3.8–10.8)

## 2020-12-20 LAB — COMPREHENSIVE METABOLIC PANEL
AG Ratio: 2 (calc) (ref 1.0–2.5)
ALT: 42 U/L (ref 9–46)
AST: 54 U/L — ABNORMAL HIGH (ref 10–40)
Albumin: 4.7 g/dL (ref 3.6–5.1)
Alkaline phosphatase (APISO): 47 U/L (ref 36–130)
BUN/Creatinine Ratio: 14 (calc) (ref 6–22)
BUN: 22 mg/dL (ref 7–25)
CO2: 23 mmol/L (ref 20–32)
Calcium: 9.6 mg/dL (ref 8.6–10.3)
Chloride: 106 mmol/L (ref 98–110)
Creat: 1.6 mg/dL — ABNORMAL HIGH (ref 0.60–1.35)
Globulin: 2.4 g/dL (calc) (ref 1.9–3.7)
Glucose, Bld: 90 mg/dL (ref 65–99)
Potassium: 4.1 mmol/L (ref 3.5–5.3)
Sodium: 136 mmol/L (ref 135–146)
Total Bilirubin: 0.5 mg/dL (ref 0.2–1.2)
Total Protein: 7.1 g/dL (ref 6.1–8.1)

## 2020-12-20 LAB — LIPID PANEL
Cholesterol: 250 mg/dL — ABNORMAL HIGH (ref <200)
HDL: 37 mg/dL — ABNORMAL LOW (ref >=40)
Non-HDL Cholesterol (Calc): 213 mg/dL (calc) — ABNORMAL HIGH (ref <130)
Total CHOL/HDL Ratio: 6.8 (calc) — ABNORMAL HIGH (ref <5.0)
Triglycerides: 410 mg/dL — ABNORMAL HIGH (ref <150)

## 2020-12-20 LAB — HEMOGLOBIN A1C
Hgb A1c MFr Bld: 5.6 % of total Hgb (ref <5.7)
Mean Plasma Glucose: 114 mg/dL
eAG (mmol/L): 6.3 mmol/L

## 2020-12-20 LAB — TSH: TSH: 2.32 mIU/L (ref 0.40–4.50)

## 2020-12-20 LAB — PSA, TOTAL AND FREE
PSA, % Free: 18 % (calc) — ABNORMAL LOW (ref >25)
PSA, Free: 0.2 ng/mL
PSA, Total: 1.1 ng/mL (ref <=4.0)

## 2020-12-20 MED ORDER — ROSUVASTATIN CALCIUM 20 MG PO TABS: 20.0000 mg | ORAL_TABLET | Freq: Every day | ORAL | 3 refills | Status: DC

## 2020-12-21 ENCOUNTER — Telehealth: Payer: Self-pay | Admitting: Neurology

## 2020-12-21 NOTE — Telephone Encounter (Signed)
Left message okay

## 2020-12-21 NOTE — Telephone Encounter (Signed)
Please see

## 2020-12-21 NOTE — Telephone Encounter (Signed)
His doc gave him crestor and he wants to know if it would interfere with his other meds. [phone] please call him and let him know

## 2021-01-03 ENCOUNTER — Telehealth: Payer: Self-pay

## 2021-01-03 MED ORDER — PRAVASTATIN SODIUM 20 MG PO TABS: 20.0000 mg | ORAL_TABLET | Freq: Every day | ORAL | 3 refills | Status: DC

## 2021-01-03 NOTE — Telephone Encounter (Signed)
I spent 5 total minutes of online digital evaluation and management services.

## 2021-01-03 NOTE — Addendum Note (Signed)
Addended by: Silverio Decamp on: 01/03/2021 09:42 AM   Modules accepted: Orders

## 2021-02-14 NOTE — Telephone Encounter (Signed)
Opened in error

## 2021-03-22 ENCOUNTER — Ambulatory Visit: Payer: 59 | Admitting: Family Medicine

## 2021-03-22 VITALS — Ht 78.0 in | Wt 280.0 lb

## 2021-03-22 DIAGNOSIS — M654 Radial styloid tenosynovitis [de Quervain]: Secondary | ICD-10-CM | POA: Diagnosis not present

## 2021-03-22 MED ORDER — METHYLPREDNISOLONE ACETATE 40 MG/ML IJ SUSP
20.0000 mg | Freq: Once | INTRAMUSCULAR | Status: AC
  Administered 2021-03-22: 20 mg via INTRA_ARTICULAR

## 2021-03-22 NOTE — Patient Instructions (Signed)
You have deQuervain's tenosynovitis of your thumb/wrist. Avoid painful activities as much as possible. Wear the thumb spica brace as often as possible to rest this. Ice 15 minutes at a time 3-4 times a day. Topical voltaren gel up to 4 times a day for pain and inflammation A cortisone injection typically helps a great deal with this and is an option - you were given this today. Follow up with me in 1 month or as needed.

## 2021-03-22 NOTE — Progress Notes (Signed)
PCP: Silverio Decamp, MD  Subjective:   HPI: Patient is a 50 y.o. male here for left wrist pain.  Patient reports for about 3 weeks he's had worsening left radial wrist pain. Pain sharp, worse with movements. No acute injury. May have started from lifting weights but he's not sure. No swelling, bruising, numbness.  Past Medical History:  Diagnosis Date   Bell's palsy    Mixed hyperlipidemia 06/28/2017   Seizures ([place]) 05/2018   had 1 grand-mal seizure, placed on Trokendi XR    Current Outpatient Medications on File Prior to Visit  Medication Sig Dispense Refill   AMBULATORY NON FORMULARY MEDICATION Continuous positive airway pressure (CPAP) machine set on AutoPAP (4-20 cmH2O), with all supplemental supplies as needed. 1 each 0   diazePAM (VALTOCO 10 MG DOSE) 10 MG/0.1ML LIQD Place 1 Dose into the nose as needed (for focal seizure lasting longer than 15 mins). Give 1 spray in each nostril (use one device per nostril). May give another dose at least 4 hours after first dose. 10 each 5   lamoTRIgine (LAMICTAL) 200 MG tablet Take 1 tablet (200 mg total) by mouth 2 (two) times daily. 90-day Rx 180 tablet 3   pravastatin (PRAVACHOL) 20 MG tablet Take 1 tablet (20 mg total) by mouth daily. 90 tablet 3   sertraline (ZOLOFT) 100 MG tablet Take 1.5 tablets (150 mg total) by mouth at bedtime. 135 tablet 3   Topiramate ER (TROKENDI XR) 200 MG CP24 TAKE 1 CAPSULE BY MOUTH EVERY DAY AT NIGHT 90 capsule 1   No current facility-administered medications on file prior to visit.    Past Surgical History:  Procedure Laterality Date   WRIST SURGERY     As a child    No Known Allergies  Ht 6' 6"  (1.981 m)   Wt 280 lb (127 kg)   BMI 32.36 kg/m   [place] Adult Exercise 03/22/2021 03/22/2021  Frequency of aerobic exercise (# of days/week) 3 3  Average time in minutes 20 20  Frequency of strengthening activities (# of days/week) 6 6    No flowsheet data found.       Objective:  Physical Exam:  Gen: NAD, comfortable in exam room  Left wrist: No deformity, swelling, bruising. FROM with 5/5 strength. Tenderness to palpation 1st dorsal compartnent.  No 1st CMC, carpal tunnel tenderness. NVI distally. Positive finkelsteins, negative tinels carpal tunnel   Assessment & Plan:  1. Left dequervains tenosynovitis - discussed options and he would like to go ahead with injection.  Thumb spica brace, icing, voltaren gel.  F/u in 1 month or prn.  After informed written consent (infection, bleeding, skin hypopigmentation) patient was seated on exam table, timeout was performed.  Area overlying left 1st dorsal compartment prepped with alcohol swab then utilizing ultrasound guidance patient's left 1st dorsal compartment was injected with 0.5:0.97m lidocaine: depomedrol.  Patient tolerated procedure well without immediate complications.

## 2021-04-21 ENCOUNTER — Telehealth: Payer: Self-pay | Admitting: Neurology

## 2021-04-21 NOTE — Telephone Encounter (Signed)
F/u submit website CoverMyMeds  Ferguson Gertner Key: [id number] - PA Case ID: [id number] Need help? Call us at [phone]  Outcome Additional Information Required This medication or product was previously approved on [id number] from 2020-07-09 to 2021-07-09.   **Please note: This request was submitted electronically. Formulary lowering, tiering exception, cost reduction and/or pre-benefit determination review (including prospective Medicare hospice reviews) requests cannot be requested using this method of submission. Providers contact us at [phone] for further assistance. Drug Trokendi XR 200MG er capsules Form [facility] Electronic Prior Authorization Form [id number] NCPDP)

## 2021-04-21 NOTE — Telephone Encounter (Signed)
Pt called in stating he is switching pharmacies from [facility] to [facility] [address] in [place]. They told them they needed authorization for them to be able to fill the Trokendi.

## 2021-04-22 ENCOUNTER — Other Ambulatory Visit: Payer: Self-pay | Admitting: Neurology

## 2021-04-24 ENCOUNTER — Telehealth: Payer: Self-pay

## 2021-04-24 ENCOUNTER — Other Ambulatory Visit: Payer: Self-pay

## 2021-04-24 NOTE — Telephone Encounter (Signed)
Pt called no answer left a voice mail to call the office when he calls back will let him know about his medication PA

## 2021-04-25 MED ORDER — TROKENDI XR 200 MG PO CP24: ORAL_CAPSULE | ORAL | 1 refills | Status: DC

## 2021-04-25 NOTE — Telephone Encounter (Signed)
Pt called an informed that medication with PA was sent to [facility] as requested

## 2021-07-09 ENCOUNTER — Other Ambulatory Visit: Payer: Self-pay | Admitting: Neurology

## 2021-07-31 ENCOUNTER — Telehealth: Payer: Self-pay | Admitting: Neurology

## 2021-07-31 NOTE — Telephone Encounter (Signed)
Patient called and said his Trokendi XR is needing a PA.  He only has two days worth of the medicine left and would like to know if there are any samples available if the PA takes longer than two days?

## 2021-07-31 NOTE — Telephone Encounter (Signed)
Ok to give samples if we have them. Gesila, pls let me know if you need anything when PA is requested, thanks

## 2021-08-01 NOTE — Telephone Encounter (Addendum)
Pt called an informed that we are working on his PA No samples given to pt  Pt called informed our samples are out of date he stated that he found more medication he has 10 days.

## 2021-08-02 ENCOUNTER — Telehealth: Payer: Self-pay

## 2021-08-02 LAB — COLOGUARD: Cologuard: NEGATIVE

## 2021-08-02 NOTE — Telephone Encounter (Signed)
New message   Your information has been sent to [facility].  Barton Fanny Key: Lexine Baton - PA Case ID: [id number] Need help? Call us at [phone] Status Sent to [facility] Trokendi XR 200MG er capsules Form [facility] Electronic Prior Authorization Form [phone] NCPDP)

## 2021-08-02 NOTE — Telephone Encounter (Signed)
Barton Fanny Key: Lexine Baton - PA Case ID: [id number] Need help? Call us at [phone] Status Sent to [facility] Trokendi XR 200MG er capsules Form [facility] Electronic Prior Authorization Form [phone] NCPDP)

## 2021-08-04 ENCOUNTER — Other Ambulatory Visit: Payer: Self-pay

## 2021-08-04 ENCOUNTER — Telehealth: Payer: Self-pay

## 2021-08-04 MED ORDER — TOPIRAMATE 100 MG PO TABS: 100.0000 mg | ORAL_TABLET | Freq: Two times a day (BID) | ORAL | 2 refills | Status: DC

## 2021-08-04 MED ORDER — TOPIRAMATE ER 200 MG PO CAP24: 200.0000 mg | ORAL_CAPSULE | Freq: Every evening | ORAL | 1 refills | Status: DC

## 2021-08-04 NOTE — Telephone Encounter (Signed)
Pt called informed that we have sent in topiramate 131m BID for [facility] to [facility] copay 10.00. pt advised to call the office if he has any side effects on the medication.

## 2021-08-04 NOTE — Telephone Encounter (Signed)
Patient called requesting an update on this.

## 2021-08-04 NOTE — Telephone Encounter (Signed)
Barton Fanny Key: Lexine Baton - PA Case ID: [id number] Need help? Call us at [phone] Outcome Deniedon February 1 Request Reference Number: [id number]. TROKENDI XR CAP 200MG is denied for not meeting the prior authorization requirement(s). Details of this decision are in the notice attached below or have been faxed to you. Drug Trokendi XR 200MG er capsules Form [facility] Electronic Prior Authorization Form [id number] NCPDP)

## 2021-08-07 ENCOUNTER — Telehealth: Payer: Self-pay | Admitting: Neurology

## 2021-08-07 NOTE — Telephone Encounter (Signed)
Pt may be calling back to let us know if he has had any adverse reaction to change in seizure medication,

## 2021-08-07 NOTE — Telephone Encounter (Signed)
Patient would like a call from heather about his medicine and insurance. Michela Pitcher he has been working with her

## 2021-08-07 NOTE — Telephone Encounter (Signed)
Pt called no answer left a voice mail to call the office back

## 2021-08-08 ENCOUNTER — Other Ambulatory Visit: Payer: Self-pay | Admitting: Neurology

## 2021-08-08 ENCOUNTER — Encounter: Payer: Self-pay | Admitting: Neurology

## 2021-08-08 MED ORDER — TOPIRAMATE ER 200 MG PO CAP24: ORAL_CAPSULE | ORAL | 3 refills | Status: DC

## 2021-08-08 NOTE — Telephone Encounter (Signed)
Patient is returning a call to someone.

## 2021-08-08 NOTE — Telephone Encounter (Signed)
F/u  Appeal letter from Dr. Delice Lesch fax [facility] @ [phone]

## 2021-08-08 NOTE — Telephone Encounter (Signed)
Side effects on generic Topiramate. Letter of appeal written, Gesila pls send as urgent appeal for Trokendi XR 238m daily. Thank you

## 2021-08-08 NOTE — Telephone Encounter (Signed)
Patient returned call and said he did have an adverse reaction, first a rash under arms and chest, then dizziness and night sweats. This was the second night of the generic medication.

## 2021-08-09 NOTE — Telephone Encounter (Signed)
F/u  August 08, 2021 Me   GD    1:48 PM Note   F/u   Appeal letter from Dr. Delice Lesch fax [facility] @ [phone]

## 2021-08-10 ENCOUNTER — Encounter: Payer: Self-pay | Admitting: Sports Medicine

## 2021-08-10 ENCOUNTER — Telehealth: Payer: Self-pay

## 2021-08-10 NOTE — Telephone Encounter (Signed)
Left msg for a return call from patient regarding results.   Cologuard is negative and should be repeated in 3 years.

## 2021-08-10 NOTE — Progress Notes (Signed)
Abstracted cologuard results as it was ordered prior the integration.

## 2021-08-10 NOTE — Telephone Encounter (Signed)
Thanks, Anzel called back in . Spoke with him regarding results

## 2021-08-11 ENCOUNTER — Ambulatory Visit: Payer: 59 | Admitting: Neurology

## 2021-08-11 ENCOUNTER — Other Ambulatory Visit: Payer: Self-pay

## 2021-08-11 ENCOUNTER — Encounter: Payer: Self-pay | Admitting: Neurology

## 2021-08-11 VITALS — BP 131/65 | HR 70 | Ht 78.0 in | Wt 303.0 lb

## 2021-08-11 DIAGNOSIS — G40009 Localization-related (focal) (partial) idiopathic epilepsy and epileptic syndromes with seizures of localized onset, not intractable, without status epilepticus: Secondary | ICD-10-CM

## 2021-08-11 DIAGNOSIS — F419 Anxiety disorder, unspecified: Secondary | ICD-10-CM | POA: Diagnosis not present

## 2021-08-11 MED ORDER — SERTRALINE HCL 100 MG PO TABS: ORAL_TABLET | ORAL | 3 refills | Status: DC

## 2021-08-11 MED ORDER — LAMOTRIGINE 200 MG PO TABS: 200.0000 mg | ORAL_TABLET | Freq: Two times a day (BID) | ORAL | 3 refills | Status: DC

## 2021-08-11 NOTE — Progress Notes (Signed)
NEUROLOGY FOLLOW UP OFFICE NOTE  Jay Cook [id number] [birth year]  HISTORY OF PRESENT ILLNESS: I had the pleasure of seeing Jay Cook in follow-up in the neurology clinic on 2/10/202.  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 He is accompanied by his significant other Jay Cook who helps supplement the history today. Records and images were personally reviewed where available. With the new year, his insurance changed coverage and has denied Trokendi XR. He has been on Trokendi XR since 2019 and has been taking Trokendi XR 28m qhs and Lamotrigine 2058mBID with no nocturnal seizures since November 2020. LiMendel Rydereports a questionable episode in April 2021 which is different from his typical seizures. He states it was not a seizure but does not recall the event. He told her he was not feeling well and went to sleep, then woke up vomiting several times. He was sitting on the toilet when he slumped over completely passed out, clammy and sweaty. He had bitten his tongue. He was really out of it the next day and did not remember the episode. They deny any staring episodes. He denies any olfactory/gustatory hallucinations, focal numbness/tingling/weakness, myoclonic jerks. He has occasional hypnic jerks in his sleep. No headaches, dizziness, diplopia, no falls. He is on Sertraline 1503maily for mood, they are interested in weaning this off. Since insurance denied Trokendi XR, he was started on generic Topiramate, which after taking 2 days caused a rash under his arms and chest, dizziness, and night sweats. It is good they found a weeks worth of Trokendi XR in his truck, which he has been taking as we await urgent appeal for Trokendi XR.   History On Initial Assessment 02/24/2019: This is a 51 year old right-handed man with a history of diet-controlled hyperlipidemia, left Bell's palsy with no residual deficits, presenting for second opinion regarding seizures. The first seizure  occurred early morning 05/04/2018, he states it was in sleep, however ER notes indicate his significant other witnessed him come out of the bathroom, sit on the bed, they lay down, then almost immediately make a grunting sound, both arms stiff and held above his hed, followed by convulsive activity causing him to fall off the bed. Seizure lasted less than 2 minutes, he was very confused and slightly combative after. In the ER, head CT without contrast was unremarkable, bloodwork overall unremarkable except for slightly elevated creatinine 1.7, UDS negative. He was evaluated by neurologist Dr. RunTrula Orefew days later, per notes MRI brain normal, EEG abnormal showing right temporal sharp and slow wave and mild diffuse slowing. He was started on Trokendi. On 10/15/2018 he woke up with slurred speech, fatigue, dry mouth, and back pain. He went to the ER 3 days later for increased delusional thought, memory deficits, decreased sleep, and increasing agitation. He was also reporting tingling in his tongue and legs. Bloodwork unremarkable. Symptoms thought secondary to Trokendi, he reports being very stressed that time undergoing divorce proceedings. He was started on Sertraline and prn Xanax. He had an episode on 11/29/2018 where he was shaking and zoned out, unresponsive with diaphoresis, 911 was called but he did not go to the ER. He continued to report numbness and tingling in his tongue after the seizure. Repeat EEG showed right temporal slowing and sharp waves. Trokendi XR dose increased to 200m4mily. He was back in the ER on 02/01/2019 for a nocturnal seizure, his 9 ye53r old heard a noise and found him unresponsive and thrashing  in bed, incontinent of urine with tongue bite. Lamotrigine was added, he is currently on an uptitration schedule increasing to 88m BID today.   He denies any staring/unresponsive episodes, gaps in time, olfactory/gustatory hallucinations, deja vu, rising epigastric sensation, myoclonic  jerks. His right arm feels numb every once in a while. He has also noticed occasional mild tremor in his right hand. He denies any headaches, dizziness, vision changes, bowel/bladder dysfunction. He lives with his children half the time with his ex-wife, works as a bBankerfor a[employer] He snores and feels tired on awakening, Dr. RTrula Orehas ordered a sleep study. He has taken Xanax only 3 or 4 times since April. He occasional drinks alcohol on the weekends and may have had a drink or 2 prior to one of the seizures. He has a history of left Bell's palsy 8-10 years ago which resolved after a month. He states his memory is okay, "I'm just getting old," he denies missing medications or bills, or getting lost driving (discussed that he should not be driving). Mood is pretty good.   Epilepsy Risk Factors:  He had a normal birth and early development.  There is no history of febrile convulsions, CNS infections such as meningitis/encephalitis, significant traumatic brain injury, neurosurgical procedures, or family history of seizures.  EEGs: MRI: I personally reviewed MRI Brain without contrast done 10/18/2018 which did not show any acute changes, hippocampi symmetric with no abnormal signal. 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  EEG done at Dr. RDesiree Haneoffice in 05/2018 and 12/2018 showed right temporal slowing and sharp waves. EEG in 03/2019 was normal.   PAST MEDICAL HISTORY: Past Medical History:  Diagnosis Date   Bell's palsy    Mixed hyperlipidemia 06/28/2017   [place]   had 1 grand-mal seizure, placed on Trokendi XR    MEDICATIONS: Current Outpatient Medications on File Prior to Visit  Medication Sig Dispense Refill   AMBULATORY NON FORMULARY MEDICATION Continuous positive airway pressure (CPAP) machine set on AutoPAP (4-20 cmH2O), with all supplemental supplies as  needed. 1 each 0   diazePAM (VALTOCO 10 MG DOSE) 10 MG/0.1ML LIQD Place 1 Dose into the nose as needed (for focal seizure lasting longer than 15 mins). Give 1 spray in each nostril (use one device per nostril). May give another dose at least 4 hours after first dose. 10 each 5   lamoTRIgine (LAMICTAL) 200 MG tablet TAKE 1 TABLET BY MOUTH TWICE DAILY 180 tablet 3   pravastatin (PRAVACHOL) 20 MG tablet Take 1 tablet (20 mg total) by mouth daily. 90 tablet 3   sertraline (ZOLOFT) 100 MG tablet Take 1.5 tablets (150 mg total) by mouth at bedtime. 135 tablet 3   Topiramate ER (TROKENDI XR) 200 MG CP24 Take 1 tablet every night (Patient not taking: Reported on 08/11/2021) 90 capsule 3   No current facility-administered medications on file prior to visit.    ALLERGIES: Allergies  Allergen Reactions   Topiramate Rash    FAMILY HISTORY: Family History  Problem Relation Age of Onset   Hyperlipidemia Father    Diabetes Neg Hx    Heart attack Neg Hx    Hypertension Neg Hx    Sudden death Neg Hx     SOCIAL HISTORY: Social History   Socioeconomic History   Marital status: Married    Spouse name: Not on file   Number of children: 2  Years of education: Not on file   Highest education level: Not on file  Occupational History   Not on file  Tobacco Use   Smoking status: Never   Smokeless tobacco: Never  Vaping Use   Vaping Use: Never used  Substance and Sexual Activity   Alcohol use: Yes    Alcohol/week: 12.0 standard drinks    Types: 12 Cans of beer per week   Drug use: Never   Sexual activity: Not on file  Other Topics Concern   Not on file  Social History Narrative   Right handed      Highest level of edu- colleg      Lives alone- kids live with him 50% of the time due to seperation   Social Determinants of Health   Financial Resource Strain: Not on file  Food Insecurity: Not on file  Transportation Needs: Not on file  Physical Activity: Not on file  Stress: Not on  file  Social Connections: Not on file  Intimate Partner Violence: Not on file     PHYSICAL EXAM: Vitals:   08/11/21 0959  BP: 131/65  Pulse: 70  SpO2: 98%   General: No acute distress Head:  Normocephalic/atraumatic Skin/Extremities: No rash, no edema Neurological Exam: alert and awake. No aphasia or dysarthria. Fund of knowledge is appropriate.  Attention and concentration are normal.   Cranial nerves: Pupils equal, round. Extraocular movements intact with no nystagmus. Visual fields full.  No facial asymmetry.  Motor: Bulk and tone normal, muscle strength 5/5 throughout with no pronator drift.   Finger to nose testing intact.  Gait narrow-based and steady, able to tandem walk adequately.  Romberg negative.   IMPRESSION: This is a 51 yo RH man with a history of diet-controlled hyperlipidemia, with right temporal lobe epilepsy. No clear seizures since 05/2019, there was an episode of brief loss off consciousness while sick in April 2021 that also raises possibility of vasovagal syncope. He is interested in weaning down medication once event-free for 2 years. Repeat 1-hour EEG will be done in April 2023. At this time, continue Trokendi XR 245m qhs and Lamotrigine 2093mBID. Awaiting decision on urgent appeal for Trokendi XR. They are interested in weaning down Sertraline, reduce to 10079mhs. He is aware of [place] driving laws to stop driving after a seizure until 6 months seizure-free. Follow-up in 1 year, call for any changes.   Thank you for allowing me to participate in his care.  Please do not hesitate to call for any questions or concerns.    KarEllouise Newer.D.   CC: Dr. TheDianah Field

## 2021-08-11 NOTE — Patient Instructions (Signed)
Schedule 1-hour EEG for April 2023. Our office will call with results. If normal, we will plan to reduce one of your medications.  2. Reduce Sertraline 161m: take 1 tablet every night  3. Continue Lamotrigine 2021mtwice a day and Trokendi XR 20074mvery night.   4. Follow-up in 1 year, call for any changes   Seizure Precautions: 1. If medication has been prescribed for you to prevent seizures, take it exactly as directed.  Do not stop taking the medicine without talking to your doctor first, even if you have not had a seizure in a long time.   2. Avoid activities in which a seizure would cause danger to yourself or to others.  Don't operate dangerous machinery, swim alone, or climb in high or dangerous places, such as on ladders, roofs, or girders.  Do not drive unless your doctor says you may.  3. If you have any warning that you may have a seizure, lay down in a safe place where you can't hurt yourself.    4.  No driving for 6 months from last seizure, as per [facility] Please refer to the following link on the [employer] for more information: [email or web address]/[email or web address]/drivingu.cfm   5.  Maintain good sleep hygiene. Avoid alcohol.  6.  Contact your doctor if you have any problems that may be related to the medicine you are taking.  7.  Call 911 and bring the patient back to the ED if:        A.  The seizure lasts longer than 5 minutes.       B.  The patient doesn't awaken shortly after the seizure  C.  The patient has new problems such as difficulty seeing, speaking or moving  D.  The patient was injured during the seizure  E.  The patient has a temperature over 102 F (39C)  F.  The patient vomited and now is having trouble breathing

## 2021-08-18 ENCOUNTER — Telehealth: Payer: Self-pay

## 2021-08-18 NOTE — Telephone Encounter (Signed)
New message   Received fax from [facility]   Determination Overturned   Medication Torkendi XR  Approved for coverage until Feb 9,2024

## 2021-08-18 NOTE — Telephone Encounter (Signed)
Pt called no answer left a voice mail that is trokendi was approved

## 2021-10-09 ENCOUNTER — Other Ambulatory Visit: Payer: Self-pay | Admitting: Neurology

## 2021-11-13 ENCOUNTER — Ambulatory Visit: Payer: 59 | Admitting: Neurology

## 2021-11-13 DIAGNOSIS — G40009 Localization-related (focal) (partial) idiopathic epilepsy and epileptic syndromes with seizures of localized onset, not intractable, without status epilepticus: Secondary | ICD-10-CM

## 2021-11-13 DIAGNOSIS — F419 Anxiety disorder, unspecified: Secondary | ICD-10-CM | POA: Diagnosis not present

## 2021-11-14 NOTE — Procedures (Signed)
ELECTROENCEPHALOGRAM REPORT ? ?Date of Study: 11/13/2021 ? ?Patient's Name: Jay Cook ?MRN: [id number] ?Date of Birth: 1970/10/06 ? ?Referring Provider: Dr. Ellouise Newer ? ?Clinical History: This is a 51 year old man with right temporal lobe epilepsy, seizure-free for 2 years, interested in weaning down medication. ? ?Medications: ?LAMICTAL 200 MG tablet ?VALTOCO 10 MG DOSE) 10 MG/0.1ML LIQD ?PRAVACHOL 20 MG tablet ?ZOLOFT 100 MG tablet ? ?Technical Summary: ?A multichannel digital 1-hour EEG recording measured by the international 10-20 system with electrodes applied with paste and impedances below 5000 ohms performed in our laboratory with EKG monitoring in an awake and asleep patient.  Hyperventilation and photic stimulation were performed.  The digital EEG was referentially recorded, reformatted, and digitally filtered in a variety of bipolar and referential montages for optimal display.   ? ?Description: ?The patient is awake and asleep during the recording.  During maximal wakefulness, there is a symmetric, medium voltage 9 Hz posterior dominant rhythm that attenuates with eye opening.  The record is symmetric.  During drowsiness and sleep, there is an increase in theta slowing of the background.  Vertex waves and symmetric sleep spindles were seen. Hyperventilation and photic stimulation did not elicit any abnormalities.  There were no epileptiform discharges or electrographic seizures seen.   ? ?EKG lead was unremarkable. ? ?Impression: ?This 1-hour awake and asleep EEG is normal.   ? ?Clinical Correlation: ?A normal EEG does not exclude a clinical diagnosis of epilepsy.  If further clinical questions remain, prolonged EEG may be helpful.  Clinical correlation is advised. ? ? ?Ellouise Newer, M.D. ? ?

## 2021-11-26 NOTE — Progress Notes (Signed)
Please inform the patient that the  1-hour EEG is normal. thanks

## 2021-12-07 ENCOUNTER — Ambulatory Visit (INDEPENDENT_AMBULATORY_CARE_PROVIDER_SITE_OTHER): Payer: 59 | Admitting: Family Medicine

## 2021-12-07 VITALS — Ht 78.0 in

## 2021-12-07 DIAGNOSIS — M654 Radial styloid tenosynovitis [de Quervain]: Secondary | ICD-10-CM

## 2021-12-07 NOTE — Progress Notes (Signed)
Patient returned today with worsening radial sided wrist pain L > R.  History of de Quervain's tenosynovitis. Responded really well to injection about a year ago but has recurred.  Bilateral wrists: No deformity. FROM with 5/5 strength. Tenderness to palpation 1st dorsal compartments.  No 1st CMC, carpal tunnel tenderness. NVI distally. Negative tinels. Positive finkelsteins.  A/P: Bilateral de Quervain's tenosynovitis - repeated injection on left today, given on right today as well.  F/u prn.  After informed written consent patient was seated on exam table, timeout was performed.  Area overlying left 1st dorsal compartment prepped with alcohol swab then utilizing ultrasound guidance patient's left 1st dorsal compartment was injected with 0.5:0.6m lidocaine: depomedrol.  Patient tolerated procedure well without immediate complications.  After informed written consent patient was seated on exam table, timeout was performed.  Area overlying right 1st dorsal compartment prepped with alcohol swab then utilizing ultrasound guidance patient's right 1st dorsal compartment was injected with 0.5:0.530mlidocaine: depomedrol.  Patient tolerated procedure well without immediate complications.

## 2021-12-25 ENCOUNTER — Other Ambulatory Visit: Payer: Self-pay | Admitting: Neurology

## 2021-12-25 ENCOUNTER — Telehealth: Payer: Self-pay | Admitting: Neurology

## 2021-12-25 MED ORDER — TOPIRAMATE ER 100 MG PO CAP24: ORAL_CAPSULE | ORAL | 0 refills | Status: DC

## 2021-12-25 NOTE — Telephone Encounter (Signed)
Pls let him know that since his EEG was normal, we can try just weaning him off the Trokendi so it would not be an issue. Continue Lamotrigine. For the Trokendi weaning schedule, it would be Trokendi 100mg  1 tablet daily for 1 week, then 1 tablet every other day for 1 week, then stop. That would be 11 tablets to wean off. Does he want to just get those 11 tablets from the pharmacy? Or wait until we can request for samples of the 100mg  tablet? Call us for any change in symptoms as he weans off Trokendi. Thanks

## 2021-12-26 ENCOUNTER — Telehealth: Payer: Self-pay | Admitting: Neurology

## 2021-12-26 MED ORDER — TOPIRAMATE ER 200 MG PO CAP24: ORAL_CAPSULE | ORAL | 11 refills | Status: DC

## 2021-12-27 ENCOUNTER — Other Ambulatory Visit (HOSPITAL_COMMUNITY): Payer: Self-pay

## 2021-12-27 NOTE — Telephone Encounter (Signed)
Pt called an informed that with copay card he will pay $0.00 for his medication, pt came by the office today and get the card. Pt was advised that prescription was sent in yesterday,

## 2021-12-27 NOTE — Telephone Encounter (Signed)
Sent to PA team for PA to be done,

## 2021-12-27 NOTE — Telephone Encounter (Signed)
I sent in the Rx for 25m tabs. Would this generate another PA? I thought the prior one was until Feb 2024. Prior letter should be in the Letters tab when PA is done, thanks

## 2021-12-28 ENCOUNTER — Other Ambulatory Visit: Payer: Self-pay

## 2021-12-28 ENCOUNTER — Other Ambulatory Visit: Payer: Self-pay | Admitting: Neurology

## 2021-12-28 MED ORDER — TOPIRAMATE ER 200 MG PO CAP24: ORAL_CAPSULE | ORAL | 11 refills | Status: DC

## 2021-12-28 NOTE — Telephone Encounter (Signed)
Called [facility] and canceled Trokendi xr 100 mg. And 200 mg. Pt wants it sent to [facility].

## 2021-12-28 NOTE — Telephone Encounter (Signed)
Pt called in stating he thought we were sending a prescription in for Trokendi to [facility]  on [address] in [place], but he has called them and they do not show one. His insurance runs out tomorrow, so he needs to pick it up.

## 2022-01-04 ENCOUNTER — Other Ambulatory Visit: Payer: Self-pay

## 2022-01-04 DIAGNOSIS — E782 Mixed hyperlipidemia: Secondary | ICD-10-CM

## 2022-01-04 MED ORDER — PRAVASTATIN SODIUM 20 MG PO TABS: 20.0000 mg | ORAL_TABLET | Freq: Every day | ORAL | 0 refills | Status: DC

## 2022-02-05 ENCOUNTER — Telehealth: Payer: Self-pay | Admitting: Neurology

## 2022-02-05 NOTE — Telephone Encounter (Signed)
Patient called about the medicine trokendi.  He states that it is going to be 1300$, he stated the card he was given to try did not work.  Is there something else he can try.  Also can he get some samples in the meantime.

## 2022-02-06 ENCOUNTER — Other Ambulatory Visit (HOSPITAL_COMMUNITY): Payer: Self-pay

## 2022-02-06 ENCOUNTER — Telehealth (HOSPITAL_COMMUNITY): Payer: Self-pay | Admitting: Pharmacy Technician

## 2022-02-06 NOTE — Telephone Encounter (Signed)
Called pateint and he has enrolled in the Trokendi program before trying to use at pharmacy. Called pharmacy to find out why the card didn't work they suggested that the patient needed a PA for this medication. I have sent this to the PA Dept and to ask Dr. Karel Jarvis if we have permission to give patient samples while we are waiting on this

## 2022-02-06 NOTE — Telephone Encounter (Signed)
Sent message from Dr. Karel Jarvis to PA team to try to help get meds approved

## 2022-02-06 NOTE — Telephone Encounter (Signed)
We don't have any more samples. Pls also let PA team know that there is a letter from February 2023 on the Media tab from [facility] because we have been working on this for a while and had to appeal it. Letter said it will be covered.

## 2022-02-06 NOTE — Telephone Encounter (Signed)
Patient left a message about trokendi medication. He states that he thinks he has been working with [facility] Seta getting it filled. He is having trouble getting it and would like a call back

## 2022-02-06 NOTE — Telephone Encounter (Signed)
Patient Advocate Encounter   Received notification that prior authorization for Topiramate ER 200MG  er capsules is required.   PA submitted on 02/06/2022 Key [id number] Status is pending       04/08/2022, CPhT Pharmacy Patient Advocate Specialist [facility] Patient Advocate Team Direct Number: [phone]  Fax: [phone]

## 2022-02-07 ENCOUNTER — Telehealth: Payer: Self-pay

## 2022-02-07 NOTE — Telephone Encounter (Signed)
Called to let patient know what pharmacy told me about needing a new PA and that we are currently working to get this approved for him

## 2022-02-09 ENCOUNTER — Other Ambulatory Visit: Payer: Self-pay

## 2022-02-09 ENCOUNTER — Telehealth: Payer: Self-pay | Admitting: Neurology

## 2022-02-09 ENCOUNTER — Other Ambulatory Visit (HOSPITAL_COMMUNITY): Payer: Self-pay

## 2022-02-09 MED ORDER — TOPIRAMATE ER 200 MG PO SPRINKLE CAP24: EXTENDED_RELEASE_CAPSULE | ORAL | 0 refills | Status: DC

## 2022-02-09 NOTE — Telephone Encounter (Signed)
Looked on [facility] and [facility] has Qudexy for a discount rate so 7 capsules have been sent in for this patient called and he is so appreciative of what we are doing

## 2022-02-09 NOTE — Telephone Encounter (Signed)
Pt called in wanting to get an update on his PA for the topiramate. He also stated he will run out on Sunday. He is wondering if we might have samples?

## 2022-02-09 NOTE — Telephone Encounter (Signed)
Sent request for update on this patient PA and unfortunately I have let patient know we do not have any samples at this time

## 2022-02-09 NOTE — Telephone Encounter (Signed)
From PA team  He has changed insurances since June.  Just got off the phone with the insurance the prior authorization is still pending

## 2022-02-14 ENCOUNTER — Telehealth: Payer: Self-pay | Admitting: Neurology

## 2022-02-14 ENCOUNTER — Other Ambulatory Visit: Payer: Self-pay

## 2022-02-14 ENCOUNTER — Other Ambulatory Visit (HOSPITAL_COMMUNITY): Payer: Self-pay

## 2022-02-14 DIAGNOSIS — G40009 Localization-related (focal) (partial) idiopathic epilepsy and epileptic syndromes with seizures of localized onset, not intractable, without status epilepticus: Secondary | ICD-10-CM

## 2022-02-14 MED ORDER — TOPIRAMATE ER 200 MG PO CAP24: ORAL_CAPSULE | ORAL | 11 refills | Status: DC

## 2022-02-14 NOTE — Telephone Encounter (Signed)
Patient called to speak with [facility] about his medication.

## 2022-02-14 NOTE — Telephone Encounter (Signed)
Sent medication to dmonte maher to try to get medication for patient

## 2022-02-14 NOTE — Telephone Encounter (Signed)
Sent to PA dept for Urgent PA. Heather called Trokendi and pharmacy to see what we can do to help

## 2022-02-16 ENCOUNTER — Telehealth: Payer: Self-pay

## 2022-02-16 ENCOUNTER — Other Ambulatory Visit: Payer: Self-pay

## 2022-02-16 MED ORDER — TOPIRAMATE ER 200 MG PO SPRINKLE CAP24: EXTENDED_RELEASE_CAPSULE | ORAL | 0 refills | Status: DC

## 2022-02-16 NOTE — Telephone Encounter (Signed)
Patient called and is running out of medicine and needing the PA but still not approved. Patient asked if I could send to [facility] the Quedexy for days to see how much it would cost. Last time I sent to [facility] and sent patient a good RX number to use to obtain that prescription

## 2022-02-18 ENCOUNTER — Other Ambulatory Visit: Payer: Self-pay | Admitting: Neurology

## 2022-02-22 NOTE — Telephone Encounter (Signed)
Patient Advocate Encounter  Received notification that the request for prior authorization for Topiramate ER 200MG  er capsules  has been denied due to There is no indication that your patient has intolerance to topiramate ER capsules (generic for Qudexy XR). Trokendi XR or its generic, topiramate extended-release capsules, are considered medically necessary for one of the following: 1. the treatment of seizure disorder when the individual has had intolerance to topiramate ER capsules (generic for Qudexy XR); or 2. The prophylaxis of migraine headaches when the individual has had intolerance to topiramate ER capsules (generic for Qudexy XR). , CPhT Pharmacy Patient Advocate Specialist [facility] Patient Advocate Team Direct Number: [phone]  Fax: [phone]

## 2022-02-26 ENCOUNTER — Other Ambulatory Visit (HOSPITAL_COMMUNITY): Payer: Self-pay

## 2022-02-26 NOTE — Telephone Encounter (Signed)
We have actually had to change the patient to Jay Cook in order to get the patient medication while waiting for this PA. Do we need a PA for Jay Cook?

## 2022-03-02 ENCOUNTER — Other Ambulatory Visit: Payer: Self-pay

## 2022-03-02 DIAGNOSIS — E782 Mixed hyperlipidemia: Secondary | ICD-10-CM

## 2022-03-02 MED ORDER — PRAVASTATIN SODIUM 20 MG PO TABS: 20.0000 mg | ORAL_TABLET | Freq: Every day | ORAL | 0 refills | Status: DC

## 2022-03-02 NOTE — Telephone Encounter (Signed)
Advised patient that he would need an appt prior to any refills. Call transferred to front desk for scheduling.

## 2022-03-13 ENCOUNTER — Ambulatory Visit (INDEPENDENT_AMBULATORY_CARE_PROVIDER_SITE_OTHER): Payer: Commercial Managed Care - HMO | Admitting: Sports Medicine

## 2022-03-13 ENCOUNTER — Encounter: Payer: Self-pay | Admitting: Sports Medicine

## 2022-03-13 VITALS — BP 130/77 | HR 80 | Ht 78.0 in | Wt 300.0 lb

## 2022-03-13 DIAGNOSIS — E291 Testicular hypofunction: Secondary | ICD-10-CM

## 2022-03-13 DIAGNOSIS — Z23 Encounter for immunization: Secondary | ICD-10-CM

## 2022-03-13 DIAGNOSIS — N139 Obstructive and reflux uropathy, unspecified: Secondary | ICD-10-CM

## 2022-03-13 DIAGNOSIS — Z0000 Encounter for general adult medical examination without abnormal findings: Secondary | ICD-10-CM | POA: Diagnosis not present

## 2022-03-13 DIAGNOSIS — E782 Mixed hyperlipidemia: Secondary | ICD-10-CM | POA: Diagnosis not present

## 2022-03-13 MED ORDER — PRAVASTATIN SODIUM 20 MG PO TABS: 20.0000 mg | ORAL_TABLET | Freq: Every day | ORAL | 3 refills | Status: DC

## 2022-03-13 NOTE — Progress Notes (Addendum)
Subjective:    CC: Annual Physical Exam  HPI:  This patient is here for their annual physical  I reviewed the past medical history, family history, social history, surgical history, and allergies today and no changes were needed.  Please see the problem list section below in epic for further details.  Past Medical History: Past Medical History:  Diagnosis Date   Bell's palsy    Mixed hyperlipidemia 06/28/2017   Seizures (HCC) 05/2018   had 1 grand-mal seizure, placed on Trokendi XR   Past Surgical History: Past Surgical History:  Procedure Laterality Date   WRIST SURGERY     As a child   Social History: Social History   Socioeconomic History   Marital status: Married    Spouse name: Not on file   Number of children: 2   Years of education: Not on file   Highest education level: Not on file  Occupational History   Not on file  Tobacco Use   Smoking status: Never   Smokeless tobacco: Never  Vaping Use   Vaping Use: Never used  Substance and Sexual Activity   Alcohol use: Yes    Alcohol/week: 12.0 standard drinks of alcohol    Types: 12 Cans of beer per week   Drug use: Never   Sexual activity: Not on file  Other Topics Concern   Not on file  Social History Narrative   Right handed      Highest level of edu- colleg      Lives alone- kids live with him 50% of the time due to seperation   Social Determinants of Health   Financial Resource Strain: Not on file  Food Insecurity: Not on file  Transportation Needs: Not on file  Physical Activity: Not on file  Stress: Not on file  Social Connections: Not on file   Family History: Family History  Problem Relation Age of Onset   Hyperlipidemia Father    Diabetes Neg Hx    Heart attack Neg Hx    Hypertension Neg Hx    Sudden death Neg Hx    Allergies: Allergies  Allergen Reactions   Topiramate Rash   Medications: See med rec.  Review of Systems: No headache, visual changes, nausea, vomiting,  diarrhea, constipation, dizziness, abdominal pain, skin rash, fevers, chills, night sweats, swollen lymph nodes, weight loss, chest pain, body aches, joint swelling, muscle aches, shortness of breath, mood changes, visual or auditory hallucinations.  Objective:    General: Well Developed, well nourished, and in no acute distress.  Neuro: Alert and oriented x3, extra-ocular muscles intact, sensation grossly intact. Cranial nerves II through XII are intact, motor, sensory, and coordinative functions are all intact. HEENT: Normocephalic, atraumatic, pupils equal round reactive to light, neck supple, no masses, no lymphadenopathy, thyroid nonpalpable. Oropharynx, nasopharynx, external ear canals are unremarkable. Skin: Warm and dry, no rashes noted.  Cardiac: Regular rate and rhythm, no murmurs rubs or gallops.  Respiratory: Clear to auscultation bilaterally. Not using accessory muscles, speaking in full sentences.  Abdominal: Soft, nontender, nondistended, positive bowel sounds, no masses, no organomegaly.  Musculoskeletal: Shoulder, elbow, wrist, hip, knee, ankle stable, and with full range of motion.  Impression and Recommendations:    The patient was counselled, risk factors were discussed, anticipatory guidance given.  Annual physical exam Annual physical as above, due for shingles and flu, checking routine labs.  Mixed hyperlipidemia Rolf has mixed hyperlipidemia, predominantly hypertriglyceridemia. Adding fenofibrate with a 45-month lipid recheck.   ____________________________________________ Jay Cook. Benjamin Stain,  M.D., ABFM., CAQSM., AME. Primary Care and Sports Medicine [place] [facility]  Adjunct Professor of Family Medicine  [place] [employer] [facility]  Restaurant manager, fast food

## 2022-03-13 NOTE — Assessment & Plan Note (Signed)
Annual physical as above, due for shingles and flu, checking routine labs.

## 2022-03-13 NOTE — Addendum Note (Signed)
Addended by: Carolin Coy on: 03/13/2022 04:51 PM   Modules accepted: Orders

## 2022-03-21 ENCOUNTER — Telehealth: Payer: Self-pay | Admitting: Neurology

## 2022-03-21 MED ORDER — TOPIRAMATE ER 200 MG PO SPRINKLE CAP24: EXTENDED_RELEASE_CAPSULE | ORAL | 3 refills | Status: DC

## 2022-03-21 NOTE — Telephone Encounter (Signed)
PT called and LM with AN. The medicine was sent to wrong pharmacy. The original [facility] sent [facility], [facility]

## 2022-03-21 NOTE — Telephone Encounter (Signed)
Refill sent in for pt,  

## 2022-03-21 NOTE — Addendum Note (Signed)
Addended by: Jake Seats on: 03/21/2022 02:10 PM   Modules accepted: Orders

## 2022-03-21 NOTE — Telephone Encounter (Signed)
Pt  needs a refill on his sprinkle capsule topiramate 200mg   [facility]

## 2022-03-21 NOTE — Telephone Encounter (Signed)
Rx was sent to [facility]

## 2022-03-23 ENCOUNTER — Telehealth: Payer: Self-pay | Admitting: Neurology

## 2022-03-23 MED ORDER — TOPIRAMATE ER 200 MG PO SPRINKLE CAP24: EXTENDED_RELEASE_CAPSULE | ORAL | 0 refills | Status: DC

## 2022-03-23 NOTE — Telephone Encounter (Signed)
Pt called informed that script was sent

## 2022-03-23 NOTE — Telephone Encounter (Signed)
1. Which medications need refilled? (List name and dosage, if known) topiramate 200 mg sprinkle  2. Which pharmacy/location is medication to be sent to? (include street and city if Management consultant) [place] at [facility]  3. Do they need a 30 day or 90 day supply? 90  Pt is out of the medication.

## 2022-03-29 ENCOUNTER — Encounter: Payer: Self-pay | Admitting: Sports Medicine

## 2022-03-29 MED ORDER — FENOFIBRATE 160 MG PO TABS: 160.0000 mg | ORAL_TABLET | Freq: Every day | ORAL | 3 refills | Status: DC

## 2022-03-29 NOTE — Addendum Note (Signed)
Addended by: Silverio Decamp on: 03/29/2022 11:18 AM   Modules accepted: Orders

## 2022-03-29 NOTE — Assessment & Plan Note (Signed)
Jay Cook has mixed hyperlipidemia, predominantly hypertriglyceridemia. Adding fenofibrate with a 60-month lipid recheck.

## 2022-03-30 LAB — CBC
HCT: 47.7 % (ref 38.5–50.0)
Hemoglobin: 16.4 g/dL (ref 13.2–17.1)
MCH: 29.5 pg (ref 27.0–33.0)
MCHC: 34.4 g/dL (ref 32.0–36.0)
MCV: 85.9 fL (ref 80.0–100.0)
MPV: 11.7 fL (ref 7.5–12.5)
Platelets: 179 10*3/uL (ref 140–400)
RBC: 5.55 10*6/uL (ref 4.20–5.80)
RDW: 12.2 % (ref 11.0–15.0)
WBC: 4.9 10*3/uL (ref 3.8–10.8)

## 2022-03-30 LAB — LIPID PANEL
Cholesterol: 178 mg/dL (ref <200)
HDL: 32 mg/dL — ABNORMAL LOW (ref >=40)
LDL Cholesterol (Calc): 106 mg/dL (calc) — ABNORMAL HIGH
Non-HDL Cholesterol (Calc): 146 mg/dL (calc) — ABNORMAL HIGH (ref <130)
Total CHOL/HDL Ratio: 5.6 (calc) — ABNORMAL HIGH (ref <5.0)
Triglycerides: 282 mg/dL — ABNORMAL HIGH (ref <150)

## 2022-03-30 LAB — COMPLETE METABOLIC PANEL WITH GFR
AG Ratio: 1.8 (calc) (ref 1.0–2.5)
ALT: 33 U/L (ref 9–46)
AST: 42 U/L — ABNORMAL HIGH (ref 10–35)
Albumin: 4.7 g/dL (ref 3.6–5.1)
Alkaline phosphatase (APISO): 50 U/L (ref 35–144)
BUN/Creatinine Ratio: 12 (calc) (ref 6–22)
BUN: 20 mg/dL (ref 7–25)
CO2: 23 mmol/L (ref 20–32)
Calcium: 9.5 mg/dL (ref 8.6–10.3)
Chloride: 106 mmol/L (ref 98–110)
Creat: 1.64 mg/dL — ABNORMAL HIGH (ref 0.70–1.30)
Globulin: 2.6 g/dL (calc) (ref 1.9–3.7)
Glucose, Bld: 110 mg/dL — ABNORMAL HIGH (ref 65–99)
Potassium: 4.6 mmol/L (ref 3.5–5.3)
Sodium: 138 mmol/L (ref 135–146)
Total Bilirubin: 0.6 mg/dL (ref 0.2–1.2)
Total Protein: 7.3 g/dL (ref 6.1–8.1)
eGFR: 51 mL/min/{1.73_m2} — ABNORMAL LOW (ref >=60)

## 2022-03-30 LAB — HEMOGLOBIN A1C
Hgb A1c MFr Bld: 5.8 % of total Hgb — ABNORMAL HIGH (ref <5.7)
Mean Plasma Glucose: 120 mg/dL
eAG (mmol/L): 6.6 mmol/L

## 2022-03-30 LAB — PSA, TOTAL AND FREE
PSA, % Free: 19 % (calc) — ABNORMAL LOW (ref >25)
PSA, Free: 0.3 ng/mL
PSA, Total: 1.6 ng/mL (ref <=4.0)

## 2022-03-30 LAB — TSH: TSH: 2.36 mIU/L (ref 0.40–4.50)

## 2022-05-28 ENCOUNTER — Telehealth: Payer: Self-pay | Admitting: Neurology

## 2022-05-28 ENCOUNTER — Other Ambulatory Visit: Payer: Self-pay

## 2022-05-28 MED ORDER — SERTRALINE HCL 100 MG PO TABS: ORAL_TABLET | ORAL | 1 refills | Status: DC

## 2022-05-28 NOTE — Telephone Encounter (Signed)
Pharmacy called they have been filling the wrong prescription new RX sent in they will fill it and they canceled the old RX

## 2022-05-28 NOTE — Telephone Encounter (Signed)
Pt called in stating he has a question about the amount of the Sertraline he is supposed to be taking. He thinks he has been taking more than he is supposed to.

## 2022-06-01 ENCOUNTER — Ambulatory Visit (INDEPENDENT_AMBULATORY_CARE_PROVIDER_SITE_OTHER): Payer: Commercial Managed Care - HMO | Admitting: Sports Medicine

## 2022-06-01 VITALS — Temp 97.7°F

## 2022-06-01 DIAGNOSIS — Z23 Encounter for immunization: Secondary | ICD-10-CM

## 2022-06-01 NOTE — Progress Notes (Signed)
Patient here for 2nd Shingrix vaccine.  Location: Right Deltoid 

## 2022-07-07 ENCOUNTER — Other Ambulatory Visit: Payer: Self-pay | Admitting: Neurology

## 2022-08-01 ENCOUNTER — Ambulatory Visit: Payer: Commercial Managed Care - HMO | Admitting: Neurology

## 2022-08-01 ENCOUNTER — Encounter: Payer: Self-pay | Admitting: Neurology

## 2022-08-01 VITALS — BP 163/80 | HR 66 | Ht 77.0 in | Wt 303.4 lb

## 2022-08-01 DIAGNOSIS — G40009 Localization-related (focal) (partial) idiopathic epilepsy and epileptic syndromes with seizures of localized onset, not intractable, without status epilepticus: Secondary | ICD-10-CM | POA: Diagnosis not present

## 2022-08-01 DIAGNOSIS — F419 Anxiety disorder, unspecified: Secondary | ICD-10-CM | POA: Diagnosis not present

## 2022-08-01 MED ORDER — LAMOTRIGINE 200 MG PO TABS: 200.0000 mg | ORAL_TABLET | Freq: Two times a day (BID) | ORAL | 3 refills | Status: DC

## 2022-08-01 MED ORDER — TOPIRAMATE ER 200 MG PO SPRINKLE CAP24: EXTENDED_RELEASE_CAPSULE | ORAL | 3 refills | Status: DC

## 2022-08-01 MED ORDER — SERTRALINE HCL 100 MG PO TABS: ORAL_TABLET | ORAL | 3 refills | Status: DC

## 2022-08-01 MED ORDER — VALTOCO 10 MG DOSE 10 MG/0.1ML NA LIQD: 1.0000 | NASAL | 5 refills | Status: AC | PRN

## 2022-08-01 NOTE — Patient Instructions (Signed)
Good to see you.  Reduce Sertraline 100mg : take 1 tablet every night  2. Continue Lamotrigine 200mg  twice a day and Topiramate ER (Qudexy) 200mg  every morning  3. Follow-up in 1 year, call for any changes   Seizure Precautions: 1. If medication has been prescribed for you to prevent seizures, take it exactly as directed.  Do not stop taking the medicine without talking to your doctor first, even if you have not had a seizure in a long time.   2. Avoid activities in which a seizure would cause danger to yourself or to others.  Don't operate dangerous machinery, swim alone, or climb in high or dangerous places, such as on ladders, roofs, or girders.  Do not drive unless your doctor says you may.  3. If you have any warning that you may have a seizure, lay down in a safe place where you can't hurt yourself.    4.  No driving for 6 months from last seizure, as per [facility].   Please refer to the following link on the [employer] website for more information: http://www.epilepsyfoundation.org/answerplace/Social/driving/drivingu.cfm   5.  Maintain good sleep hygiene. Avoid alcohol.  6.  Contact your doctor if you have any problems that may be related to the medicine you are taking.  7.  Call 911 and bring the patient back to the ED if:        A.  The seizure lasts longer than 5 minutes.       B.  The patient doesn't awaken shortly after the seizure  C.  The patient has new problems such as difficulty seeing, speaking or moving  D.  The patient was injured during the seizure  E.  The patient has a temperature over 102 F (39C)  F.  The patient vomited and now is having trouble breathing

## 2022-08-01 NOTE — Progress Notes (Signed)
NEUROLOGY FOLLOW UP OFFICE NOTE  Jay Cook [id number] 07-18-1970  HISTORY OF PRESENT ILLNESS: I had the pleasure of seeing Jay Cook in follow-up in the neurology clinic on 08/01/2022. 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 He is again accompanied by his significant other Jay Cook who helps supplement the history today. Records and images were personally reviewed where available.  His 1-hour EEG in 10/2021 was normal. He continues on Qudexy XR 200mg  daily and Lamotrigine 200mg  BID without seizures or side effects. No nocturnal seizures since 05/2019. No staring/unresponsive episodes, gaps in time, olfactory/gustatory hallucinations, focal numbness/tingling/weakness, myoclonic jerks. No headaches, dizziness, vision changes, no falls. He has not noticed any memory changes, but Jay Cook has noticed little things, such as forgetting parts of conversations or little tasks or where things are supposed to go. He denies getting lost driving. He may miss a dose of medication occasionally with no issues. He usually gets good sleep. Mood is good, he is on Sertraline 100mg  1.5 tabs daily.    History On Initial Assessment 02/24/2019: This is a 52 year old right-handed man with a history of diet-controlled hyperlipidemia, left Bell's palsy with no residual deficits, presenting for second opinion regarding seizures. The first seizure occurred early morning 05/04/2018, he states it was in sleep, however ER notes indicate his significant other witnessed him come out of the bathroom, sit on the bed, they lay down, then almost immediately make a grunting sound, both arms stiff and held above his hed, followed by convulsive activity causing him to fall off the bed. Seizure lasted less than 2 minutes, he was very confused and slightly combative after. In the ER, head CT without contrast was unremarkable, bloodwork overall unremarkable except for slightly elevated creatinine 1.7, UDS negative.  He was evaluated by neurologist Dr. Trula Cook a few days later, per notes MRI brain normal, EEG abnormal showing right temporal sharp and slow wave and mild diffuse slowing. He was started on Trokendi. On 10/15/2018 he woke up with slurred speech, fatigue, dry mouth, and back pain. He went to the ER 3 days later for increased delusional thought, memory deficits, decreased sleep, and increasing agitation. He was also reporting tingling in his tongue and legs. Bloodwork unremarkable. Symptoms thought secondary to Trokendi, he reports being very stressed that time undergoing divorce proceedings. He was started on Sertraline and prn Xanax. He had an episode on 11/29/2018 where he was shaking and zoned out, unresponsive with diaphoresis, 911 was called but he did not go to the ER. He continued to report numbness and tingling in his tongue after the seizure. Repeat EEG showed right temporal slowing and sharp waves. Trokendi XR dose increased to 200mg  daily. He was back in the ER on 02/01/2019 for a nocturnal seizure, his 52 year old heard a noise and found him unresponsive and thrashing in bed, incontinent of urine with tongue bite. Lamotrigine was added, he is currently on an uptitration schedule increasing to 50mg  BID today.   He denies any staring/unresponsive episodes, gaps in time, olfactory/gustatory hallucinations, deja vu, rising epigastric sensation, myoclonic jerks. His right arm feels numb every once in a while. He has also noticed occasional mild tremor in his right hand. He denies any headaches, dizziness, vision changes, bowel/bladder dysfunction. He lives with his children half the time with his ex-wife, works as a Banker for [employer]. He snores and feels tired on awakening, Dr. Trula Cook has ordered a sleep study. He has taken Xanax  only 3 or 4 times since April. He occasional drinks alcohol on the weekends and may have had a drink or 2 prior to one of the seizures. He has a history of left Bell's palsy  8-10 years ago which resolved after a month. He states his memory is okay, "I'm just getting old," he denies missing medications or bills, or getting lost driving (discussed that he should not be driving). Mood is pretty good.   Epilepsy Risk Factors:  He had a normal birth and early development.  There is no history of febrile convulsions, CNS infections such as meningitis/encephalitis, significant traumatic brain injury, neurosurgical procedures, or family history of seizures.  EEGs: MRI: I personally reviewed MRI Brain without contrast done 10/18/2018 which did not show any acute changes, hippocampi symmetric with no abnormal signal. 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  EEG done at Jay Cook office in 05/2018 and 12/2018 showed right temporal slowing and sharp waves. EEG in 03/2019 was normal.  PAST MEDICAL HISTORY: Past Medical History:  Diagnosis Date   Bell's palsy    Mixed hyperlipidemia 06/28/2017   Seizures ([place]) 05/2018   had 1 grand-mal seizure, placed on Trokendi XR    MEDICATIONS: Current Outpatient Medications on File Prior to Visit  Medication Sig Dispense Refill   AMBULATORY NON FORMULARY MEDICATION Continuous positive airway pressure (CPAP) machine set on AutoPAP (4-20 cmH2O), with all supplemental supplies as needed. 1 each 0   diazePAM (VALTOCO 10 MG DOSE) 10 MG/0.1ML LIQD Place 1 Dose into the nose as needed (for focal seizure lasting longer than 15 mins). Give 1 spray in each nostril (use one device per nostril). May give another dose at least 4 hours after first dose. 10 each 5   doxycycline (VIBRAMYCIN) 50 MG capsule Take 50 mg by mouth 3 (three) times daily.     lamoTRIgine (LAMICTAL) 200 MG tablet TAKE 1 TABLET BY MOUTH TWICE DAILY 180 tablet 0   pravastatin (PRAVACHOL) 20 MG tablet Take 1 tablet (20 mg total) by mouth daily. 90 tablet 3   sertraline  (ZOLOFT) 100 MG tablet TAKE 1 AND 1/2 TABLETS BY MOUTH EVERY NIGHT AT BEDTIME 90 tablet 1   topiramate ER (QUDEXY XR) 200 MG CS24 sprinkle capsule Take 1 capsule every night 90 capsule 0   No current facility-administered medications on file prior to visit.    ALLERGIES: Allergies  Allergen Reactions   Topiramate Rash    FAMILY HISTORY: Family History  Problem Relation Age of Onset   Hyperlipidemia Father    Diabetes Neg Hx    Heart attack Neg Hx    Hypertension Neg Hx    Sudden death Neg Hx     SOCIAL HISTORY: Social History   Socioeconomic History   Marital status: Married    Spouse name: Not on file   Number of children: 2   Years of education: Not on file   Highest education level: Not on file  Occupational History   Not on file  Tobacco Use   Smoking status: Never   Smokeless tobacco: Never  Vaping Use   Vaping Use: Never used  Substance and Sexual Activity   Alcohol use: Yes    Alcohol/week: 12.0 standard drinks of alcohol    Types: 12 Cans of beer per week   Drug use: Never   Sexual activity: Not on file  Other Topics Concern   Not on file  Social History Narrative   Right handed      Highest level of edu- colleg      Lives alone- kids live with him 50% of the time due to seperation   Social Determinants of Health   Financial Resource Strain: Not on file  Food Insecurity: Not on file  Transportation Needs: Not on file  Physical Activity: Not on file  Stress: Not on file  Social Connections: Not on file  Intimate Partner Violence: Not on file     PHYSICAL EXAM: Vitals:   08/01/22 1442  BP: (!) 163/80  Pulse: 66  SpO2: 97%   General: No acute distress Head:  Normocephalic/atraumatic Skin/Extremities: No rash, no edema Neurological Exam: alert and awake. No aphasia or dysarthria. Fund of knowledge is appropriate.  Recent and remote memory are intact, 3/3 delayed recall.  Attention and concentration are normal, 5/5 WORLD backwards. Able to  name and repeat.   Cranial nerves: Pupils equal, round. Extraocular movements intact with no nystagmus. Visual fields full.  No facial asymmetry.  Motor: Bulk and tone normal, muscle strength 5/5 throughout with no pronator drift.   Finger to nose testing intact.  Gait narrow-based and steady, able to tandem walk adequately.  Romberg negative.   IMPRESSION: This is a 52 yo RH man with a history of diet-controlled hyperlipidemia, with right temporal lobe epilepsy. He has been seizure-free since 05/2019 on Lamotrigine 200mg  BID and Qudexy 200mg  daily. His 1-hour EEG in 10/2021 was normal. He has expressed interest in weaning down medication, we had an extensive discussion about pros and cons, discussed risks of breakthrough seizure with any medication adjustment, they will discuss further at home and let me know their decision. For now, refills sent for all his medications. They would like to proceed with reducing Sertraline to 100mg  qhs. He is aware of [place] driving laws to stop driving after a seizure until 6 months seizure-free. Follow-up in 1 year, call for any changes.   Thank you for allowing me to participate in his care.  Please do not hesitate to call for any questions or concerns.    Jay Cook, M.D.   CC: Dr. Dianah Field

## 2022-08-10 ENCOUNTER — Encounter: Payer: Commercial Managed Care - HMO | Admitting: Sports Medicine

## 2022-08-17 ENCOUNTER — Encounter: Payer: Self-pay | Admitting: Sports Medicine

## 2022-08-17 ENCOUNTER — Ambulatory Visit (INDEPENDENT_AMBULATORY_CARE_PROVIDER_SITE_OTHER): Payer: Commercial Managed Care - HMO | Admitting: Sports Medicine

## 2022-08-17 VITALS — BP 154/78 | HR 68 | Wt 285.0 lb

## 2022-08-17 DIAGNOSIS — E782 Mixed hyperlipidemia: Secondary | ICD-10-CM | POA: Diagnosis not present

## 2022-08-17 DIAGNOSIS — E669 Obesity, unspecified: Secondary | ICD-10-CM | POA: Diagnosis not present

## 2022-08-17 DIAGNOSIS — E291 Testicular hypofunction: Secondary | ICD-10-CM | POA: Diagnosis not present

## 2022-08-17 DIAGNOSIS — I10 Essential (primary) hypertension: Secondary | ICD-10-CM

## 2022-08-17 DIAGNOSIS — Z0000 Encounter for general adult medical examination without abnormal findings: Secondary | ICD-10-CM | POA: Diagnosis not present

## 2022-08-17 NOTE — Progress Notes (Addendum)
Subjective:    CC: Annual Physical Exam  HPI:  This patient is here for their annual physical  I reviewed the past medical history, family history, social history, surgical history, and allergies today and no changes were needed.  Please see the problem list section below in epic for further details.  Past Medical History: Past Medical History:  Diagnosis Date   Bell's palsy    Mixed hyperlipidemia 06/28/2017   Seizures ([place]) 05/2018   had 1 grand-mal seizure, placed on Trokendi XR   Past Surgical History: Past Surgical History:  Procedure Laterality Date   WRIST SURGERY     As a child   Social History: Social History   Socioeconomic History   Marital status: Married    Spouse name: Not on file   Number of children: 2   Years of education: Not on file   Highest education level: Not on file  Occupational History   Not on file  Tobacco Use   Smoking status: Never   Smokeless tobacco: Never  Vaping Use   Vaping Use: Never used  Substance and Sexual Activity   Alcohol use: Yes    Alcohol/week: 12.0 standard drinks of alcohol    Types: 12 Cans of beer per week   Drug use: Never   Sexual activity: Not on file  Other Topics Concern   Not on file  Social History Narrative   Right handed      Highest level of edu- colleg      Lives alone- kids live with him 50% of the time due to seperation   Social Determinants of Health   Financial Resource Strain: Not on file  Food Insecurity: Not on file  Transportation Needs: Not on file  Physical Activity: Not on file  Stress: Not on file  Social Connections: Not on file   Family History: Family History  Problem Relation Age of Onset   Hyperlipidemia Father    Diabetes Neg Hx    Heart attack Neg Hx    Hypertension Neg Hx    Sudden death Neg Hx    Allergies: Allergies  Allergen Reactions   Topiramate Rash   Medications: See med rec.  Review of Systems: No headache, visual changes, nausea, vomiting,  diarrhea, constipation, dizziness, abdominal pain, skin rash, fevers, chills, night sweats, swollen lymph nodes, weight loss, chest pain, body aches, joint swelling, muscle aches, shortness of breath, mood changes, visual or auditory hallucinations.  Objective:    General: Well Developed, well nourished, and in no acute distress.  Neuro: Alert and oriented x3, extra-ocular muscles intact, sensation grossly intact. Cranial nerves II through XII are intact, motor, sensory, and coordinative functions are all intact. HEENT: Normocephalic, atraumatic, pupils equal round reactive to light, neck supple, no masses, no lymphadenopathy, thyroid nonpalpable. Oropharynx, nasopharynx, external ear canals are unremarkable. Skin: Warm and dry, no rashes noted.  Cardiac: Regular rate and rhythm, no murmurs rubs or gallops.  Respiratory: Clear to auscultation bilaterally. Not using accessory muscles, speaking in full sentences.  Abdominal: Soft, nontender, nondistended, positive bowel sounds, no masses, no organomegaly.  Musculoskeletal: Shoulder, elbow, wrist, hip, knee, ankle stable, and with full range of motion.  Impression and Recommendations:    The patient was counselled, risk factors were discussed, anticipatory guidance given.  Annual physical exam Annual physical as above  Mixed hyperlipidemia Mixed hyperlipidemia, predominately hypertriglyceridemia, never picked up the fenofibrate, continue pravastatin, will get a baseline early next week, he will work on dietary changes including 15 pound  weight loss over the next 3 months and then we can recheck his labs.  Continue pravastatin, triglycerides are still significantly elevated, adding fenofibrate.  Obesity (BMI 30-39.9) [place] does need to lose a lot of weight, he will aim for 15 pounds over the next 3 to 6 months, if he does not at the target we will need to discuss GLP-1 treatment.  Hypertension Blood pressure is elevated, he would like to try  lifestyle changes including low-sodium diet before considering medication, I would like to hear back from him in 3 months.   ____________________________________________ Gwen Her. Dianah Field, M.D., ABFM., CAQSM., AME. Primary Care and Sports Medicine [place] [facility]  Adjunct Professor of [place] [employer] [facility] [employer]  Risk manager

## 2022-08-17 NOTE — Assessment & Plan Note (Signed)
Jay Cook does need to lose a lot of weight, he will aim for 15 pounds over the next 3 to 6 months, if he does not at the target we will need to discuss GLP-1 treatment.

## 2022-08-17 NOTE — Assessment & Plan Note (Signed)
Blood pressure is elevated, he would like to try lifestyle changes including low-sodium diet before considering medication, I would like to hear back from him in 3 months.

## 2022-08-17 NOTE — Assessment & Plan Note (Signed)
Annual physical as above.  

## 2022-08-17 NOTE — Assessment & Plan Note (Addendum)
 Mixed hyperlipidemia, predominately hypertriglyceridemia, never picked up the fenofibrate , continue pravastatin , will get a baseline early next week, he will work on dietary changes including 15 pound weight loss over the next 3 months and then we can recheck his labs.  Continue pravastatin , triglycerides are still significantly elevated, adding fenofibrate .

## 2022-08-20 ENCOUNTER — Ambulatory Visit: Payer: 59 | Admitting: Neurology

## 2022-08-21 MED ORDER — FENOFIBRATE 160 MG PO TABS: 160.0000 mg | ORAL_TABLET | Freq: Every day | ORAL | 3 refills | Status: AC

## 2022-08-21 NOTE — Addendum Note (Signed)
Addended by: Silverio Decamp on: 08/21/2022 12:50 PM   Modules accepted: Orders

## 2022-08-23 LAB — COMPLETE METABOLIC PANEL WITH GFR
AG Ratio: 2 (calc) (ref 1.0–2.5)
ALT: 33 U/L (ref 9–46)
AST: 44 U/L — ABNORMAL HIGH (ref 10–35)
Albumin: 4.5 g/dL (ref 3.6–5.1)
Alkaline phosphatase (APISO): 51 U/L (ref 35–144)
BUN/Creatinine Ratio: 12 (calc) (ref 6–22)
BUN: 19 mg/dL (ref 7–25)
CO2: 23 mmol/L (ref 20–32)
Calcium: 9.3 mg/dL (ref 8.6–10.3)
Chloride: 108 mmol/L (ref 98–110)
Creat: 1.63 mg/dL — ABNORMAL HIGH (ref 0.70–1.30)
Globulin: 2.3 g/dL (calc) (ref 1.9–3.7)
Glucose, Bld: 119 mg/dL — ABNORMAL HIGH (ref 65–99)
Potassium: 4.6 mmol/L (ref 3.5–5.3)
Sodium: 141 mmol/L (ref 135–146)
Total Bilirubin: 0.5 mg/dL (ref 0.2–1.2)
Total Protein: 6.8 g/dL (ref 6.1–8.1)
eGFR: 51 mL/min/{1.73_m2} — ABNORMAL LOW (ref >=60)

## 2022-08-23 LAB — CBC
HCT: 47.7 % (ref 38.5–50.0)
Hemoglobin: 16 g/dL (ref 13.2–17.1)
MCH: 29 pg (ref 27.0–33.0)
MCHC: 33.5 g/dL (ref 32.0–36.0)
MCV: 86.4 fL (ref 80.0–100.0)
MPV: 12 fL (ref 7.5–12.5)
Platelets: 199 10*3/uL (ref 140–400)
RBC: 5.52 10*6/uL (ref 4.20–5.80)
RDW: 13 % (ref 11.0–15.0)
WBC: 6.1 10*3/uL (ref 3.8–10.8)

## 2022-08-23 LAB — HEMOGLOBIN A1C
Hgb A1c MFr Bld: 5.8 % of total Hgb — ABNORMAL HIGH (ref <5.7)
Mean Plasma Glucose: 120 mg/dL
eAG (mmol/L): 6.6 mmol/L

## 2022-08-23 LAB — TESTOSTERONE, FREE & TOTAL
Free Testosterone: 320.6 pg/mL — ABNORMAL HIGH (ref 35.0–155.0)
Testosterone, Total, LC-MS-MS: 1233 ng/dL — ABNORMAL HIGH (ref 250–1100)

## 2022-08-23 LAB — LIPID PANEL
Cholesterol: 180 mg/dL (ref <200)
HDL: 35 mg/dL — ABNORMAL LOW (ref >=40)
LDL Cholesterol (Calc): 106 mg/dL (calc) — ABNORMAL HIGH
Non-HDL Cholesterol (Calc): 145 mg/dL (calc) — ABNORMAL HIGH (ref <130)
Total CHOL/HDL Ratio: 5.1 (calc) — ABNORMAL HIGH (ref <5.0)
Triglycerides: 276 mg/dL — ABNORMAL HIGH (ref <150)

## 2022-08-23 LAB — TSH: TSH: 2.03 mIU/L (ref 0.40–4.50)

## 2022-08-24 ENCOUNTER — Other Ambulatory Visit: Payer: Self-pay | Admitting: Sports Medicine

## 2022-09-01 DIAGNOSIS — G4733 Obstructive sleep apnea (adult) (pediatric): Secondary | ICD-10-CM | POA: Diagnosis not present

## 2022-10-02 DIAGNOSIS — G4733 Obstructive sleep apnea (adult) (pediatric): Secondary | ICD-10-CM | POA: Diagnosis not present

## 2022-10-31 DIAGNOSIS — E291 Testicular hypofunction: Secondary | ICD-10-CM | POA: Diagnosis not present

## 2022-10-31 DIAGNOSIS — N5201 Erectile dysfunction due to arterial insufficiency: Secondary | ICD-10-CM | POA: Diagnosis not present

## 2022-10-31 DIAGNOSIS — Z125 Encounter for screening for malignant neoplasm of prostate: Secondary | ICD-10-CM | POA: Diagnosis not present

## 2022-10-31 DIAGNOSIS — E349 Endocrine disorder, unspecified: Secondary | ICD-10-CM | POA: Diagnosis not present

## 2022-11-01 DIAGNOSIS — G4733 Obstructive sleep apnea (adult) (pediatric): Secondary | ICD-10-CM | POA: Diagnosis not present

## 2022-11-16 ENCOUNTER — Ambulatory Visit: Payer: Commercial Managed Care - HMO | Admitting: Sports Medicine

## 2022-11-21 ENCOUNTER — Telehealth: Payer: Self-pay | Admitting: Sports Medicine

## 2022-11-21 DIAGNOSIS — E782 Mixed hyperlipidemia: Secondary | ICD-10-CM

## 2022-11-21 NOTE — Telephone Encounter (Signed)
Orders placed.

## 2022-11-21 NOTE — Telephone Encounter (Signed)
Pt called. He wants lab order put in to get labwork  done on the day of his appt on 5/24.

## 2022-11-23 ENCOUNTER — Encounter: Payer: Self-pay | Admitting: Sports Medicine

## 2022-11-23 ENCOUNTER — Ambulatory Visit (INDEPENDENT_AMBULATORY_CARE_PROVIDER_SITE_OTHER): Payer: Commercial Managed Care - HMO | Admitting: Sports Medicine

## 2022-11-23 VITALS — BP 134/76 | HR 73 | Ht 77.0 in | Wt 290.0 lb

## 2022-11-23 DIAGNOSIS — E782 Mixed hyperlipidemia: Secondary | ICD-10-CM

## 2022-11-23 DIAGNOSIS — I10 Essential (primary) hypertension: Secondary | ICD-10-CM

## 2022-11-23 NOTE — Progress Notes (Signed)
    Procedures performed today:    None.  Independent interpretation of notes and tests performed by another provider:   None.  Brief History, Exam, Impression, and Recommendations:    Mixed hyperlipidemia Shakell did not start his fenofibrate, rechecking lipids today, he will start it if triglycerides remain elevated.  Hypertension Blood pressure under adequate control today.  I spent 30 minutes of total time managing this patient today, this includes chart review, face to face, and non-face to face time.  ____________________________________________ Ihor Austin. Benjamin Stain, M.D., ABFM., CAQSM., AME. Primary Care and Sports Medicine [place] [facility]  Adjunct Professor of Family Medicine  [place] [employer] [facility] [employer]  Restaurant manager, fast food

## 2022-11-23 NOTE — Assessment & Plan Note (Signed)
Blood pressure under adequate control today.

## 2022-11-23 NOTE — Assessment & Plan Note (Signed)
Samier did not start his fenofibrate, rechecking lipids today, he will start it if triglycerides remain elevated.

## 2022-11-24 LAB — LIPID PANEL
Cholesterol: 159 mg/dL (ref <200)
HDL: 34 mg/dL — ABNORMAL LOW (ref >=40)
LDL Cholesterol (Calc): 97 mg/dL (calc)
Non-HDL Cholesterol (Calc): 125 mg/dL (calc) (ref <130)
Total CHOL/HDL Ratio: 4.7 (calc) (ref <5.0)
Triglycerides: 183 mg/dL — ABNORMAL HIGH (ref <150)

## 2022-11-24 LAB — COMPREHENSIVE METABOLIC PANEL
AG Ratio: 2 (calc) (ref 1.0–2.5)
ALT: 26 U/L (ref 9–46)
AST: 40 U/L — ABNORMAL HIGH (ref 10–35)
Albumin: 4.7 g/dL (ref 3.6–5.1)
Alkaline phosphatase (APISO): 49 U/L (ref 35–144)
BUN/Creatinine Ratio: 14 (calc) (ref 6–22)
BUN: 24 mg/dL (ref 7–25)
CO2: 24 mmol/L (ref 20–32)
Calcium: 9.5 mg/dL (ref 8.6–10.3)
Chloride: 107 mmol/L (ref 98–110)
Creat: 1.66 mg/dL — ABNORMAL HIGH (ref 0.70–1.30)
Globulin: 2.4 g/dL (calc) (ref 1.9–3.7)
Glucose, Bld: 99 mg/dL (ref 65–99)
Potassium: 4.4 mmol/L (ref 3.5–5.3)
Sodium: 138 mmol/L (ref 135–146)
Total Bilirubin: 0.6 mg/dL (ref 0.2–1.2)
Total Protein: 7.1 g/dL (ref 6.1–8.1)

## 2022-11-24 LAB — CBC
HCT: 50.4 % — ABNORMAL HIGH (ref 38.5–50.0)
Hemoglobin: 16.6 g/dL (ref 13.2–17.1)
MCH: 28.5 pg (ref 27.0–33.0)
MCHC: 32.9 g/dL (ref 32.0–36.0)
MCV: 86.4 fL (ref 80.0–100.0)
MPV: 11.9 fL (ref 7.5–12.5)
Platelets: 198 10*3/uL (ref 140–400)
RBC: 5.83 10*6/uL — ABNORMAL HIGH (ref 4.20–5.80)
RDW: 12.8 % (ref 11.0–15.0)
WBC: 6.1 10*3/uL (ref 3.8–10.8)

## 2022-11-24 LAB — HEMOGLOBIN A1C
Hgb A1c MFr Bld: 6 % of total Hgb — ABNORMAL HIGH (ref <5.7)
Mean Plasma Glucose: 126 mg/dL
eAG (mmol/L): 7 mmol/L

## 2022-11-24 LAB — TSH: TSH: 1.87 mIU/L (ref 0.40–4.50)

## 2022-12-27 DIAGNOSIS — B078 Other viral warts: Secondary | ICD-10-CM | POA: Diagnosis not present

## 2022-12-27 DIAGNOSIS — L718 Other rosacea: Secondary | ICD-10-CM | POA: Diagnosis not present

## 2023-01-15 ENCOUNTER — Other Ambulatory Visit: Payer: Self-pay | Admitting: *Deleted

## 2023-01-15 ENCOUNTER — Ambulatory Visit
Admission: RE | Admit: 2023-01-15 | Discharge: 2023-01-15 | Disposition: A | Discharge status: Home / Self Care | Payer: BC Managed Care – PPO | Source: Ambulatory Visit | Attending: Family Medicine | Admitting: Family Medicine

## 2023-01-15 DIAGNOSIS — M79671 Pain in right foot: Secondary | ICD-10-CM

## 2023-01-16 ENCOUNTER — Ambulatory Visit: Payer: BC Managed Care – PPO | Admitting: Family Medicine

## 2023-01-16 VITALS — BP 142/70 | Ht 77.0 in | Wt 292.0 lb

## 2023-01-16 DIAGNOSIS — S92354A Nondisplaced fracture of fifth metatarsal bone, right foot, initial encounter for closed fracture: Secondary | ICD-10-CM

## 2023-01-16 DIAGNOSIS — S92309A Fracture of unspecified metatarsal bone(s), unspecified foot, initial encounter for closed fracture: Secondary | ICD-10-CM | POA: Insufficient documentation

## 2023-01-16 NOTE — Patient Instructions (Addendum)
You have a fracture in the 5th metatarsal bone of the right foot. We have put you in an ankle boot and referring you for orthopedic surgery evaluation as this fracture has a high risk of nonunion leading to chronic pain.   You are to be nonweightbearing with crutches until otherwise informed by the orthopedic surgeon. For pain in the meantime I would take Tylenol and ibuprofen.  You can consider icing while out of the boot for pain and swelling.  Dr Nicki Guadalajara [address] [place] Friday July 19th at 1045a Arrival time is [phone]

## 2023-01-16 NOTE — Progress Notes (Signed)
PCP: Monica Becton, MD  Subjective:   HPI: Patient is a 52 y.o. male here for evaluation of right foot pain.  He is presenting with acute pain of his right lateral foot with an onset of what he thinks was Sunday 7/14.  He denies any inciting injury/trauma.  States that he has had some on and off discomfort on the lateral aspect of this foot for the past few weeks that acutely worsened Sunday.  Notable swelling.  He had radiographs of this foot performed on 01/15/2023.  He has been utilizing a rigid sole plate in his shoe up to today's appointment.  Has not been running for exercise but does like to go to the gym.  Past Medical History:  Diagnosis Date   Bell's palsy    Mixed hyperlipidemia 06/28/2017   Seizures (HCC) 05/2018   had 1 grand-mal seizure, placed on Trokendi XR    Current Outpatient Medications on File Prior to Visit  Medication Sig Dispense Refill   AMBULATORY NON FORMULARY MEDICATION Continuous positive airway pressure (CPAP) machine set on AutoPAP (4-20 cmH2O), with all supplemental supplies as needed. 1 each 0   diazePAM (VALTOCO 10 MG DOSE) 10 MG/0.1ML LIQD Place 1 Dose into the nose as needed (for focal seizure lasting longer than 15 mins). Give 1 spray in each nostril (use one device per nostril). May give another dose at least 4 hours after first dose. 10 each 5   doxycycline (VIBRAMYCIN) 50 MG capsule Take 50 mg by mouth 3 (three) times daily.     fenofibrate 160 MG tablet Take 1 tablet (160 mg total) by mouth daily. 90 tablet 3   lamoTRIgine (LAMICTAL) 200 MG tablet Take 1 tablet (200 mg total) by mouth 2 (two) times daily. 180 tablet 3   pravastatin (PRAVACHOL) 20 MG tablet Take 1 tablet (20 mg total) by mouth daily. 90 tablet 3   sertraline (ZOLOFT) 100 MG tablet Take 1 tablet every night 90 tablet 3   testosterone enanthate (DELATESTRYL) 200 MG/ML injection Inject 100 mg into the muscle every 14 (fourteen) days. For IM use only     topiramate ER (QUDEXY XR)  200 MG CS24 sprinkle capsule Take 1 tablet every morning 90 capsule 3   No current facility-administered medications on file prior to visit.    Past Surgical History:  Procedure Laterality Date   WRIST SURGERY     As a child    Allergies  Allergen Reactions   Topiramate Rash    BP (!) 142/70   Ht 6\' 5"  (1.956 m)   Wt 292 lb (132.5 kg)   BMI 34.63 kg/m      03/22/2021   10:02 AM 03/22/2021   10:09 AM  [facility]  Frequency of aerobic exercise (# of days/week) 3 3  Average time in minutes 20 20  Frequency of strengthening activities (# of days/week) 6 6        No data to display              Objective:  Physical Exam:  Gen: NAD, comfortable in exam room  Right Foot/Ankle No gross deformity or ecchymosis.  Focal swelling over the mid aspect of fifth metatarsal.   FROM TTP along the mid to distal shaft of the 5th metatarsal. No TTP along 4th MT, 5th MTP, medial or lateral malleoli. NV intact distally.  Three-view right foot x-rays from 01/15/23 were personally reviewed and show a radiolucency that appears to transverse the cross-section  of the proximal fifth metatarsal.  Nondisplaced and no avulsion fractures noted.  No other acute bony abnormalities noted.   Assessment & Plan:  1. 1. Closed nondisplaced fracture of fifth metatarsal bone of right foot, initial encounter Noted on review of x-rays.  Interestingly no inciting trauma which raises the question of whether or not this is a true Jones fracture versus a stress fracture.  We discussed management options including boot immobilization and nonweightbearing for 6 to 12 weeks with follow-up plain films versus referral to ortho given the high risk of nonunion with this type of fracture.  The patient was amenable to the idea of surgical consultation which has been scheduled for this Friday.  He was provided a boot and crutches and informed to be NWB until further specified by orthopedics.   Recommended Tylenol and anti-inflammatories for pain.

## 2023-01-18 DIAGNOSIS — S92301A Fracture of unspecified metatarsal bone(s), right foot, initial encounter for closed fracture: Secondary | ICD-10-CM | POA: Diagnosis not present

## 2023-03-07 DIAGNOSIS — N5201 Erectile dysfunction due to arterial insufficiency: Secondary | ICD-10-CM | POA: Diagnosis not present

## 2023-03-08 DIAGNOSIS — M79671 Pain in right foot: Secondary | ICD-10-CM | POA: Diagnosis not present

## 2023-03-19 ENCOUNTER — Ambulatory Visit: Payer: BC Managed Care – PPO | Admitting: Medical-Surgical

## 2023-03-19 ENCOUNTER — Encounter: Payer: Self-pay | Admitting: Medical-Surgical

## 2023-03-19 VITALS — BP 143/74 | HR 79 | Resp 20 | Ht 77.0 in | Wt 292.0 lb

## 2023-03-19 DIAGNOSIS — R21 Rash and other nonspecific skin eruption: Secondary | ICD-10-CM | POA: Diagnosis not present

## 2023-03-19 DIAGNOSIS — Z23 Encounter for immunization: Secondary | ICD-10-CM

## 2023-03-19 MED ORDER — CLOTRIMAZOLE-BETAMETHASONE 1-0.05 % EX CREA: 1.0000 | TOPICAL_CREAM | Freq: Two times a day (BID) | CUTANEOUS | 0 refills | Status: AC

## 2023-03-19 NOTE — Progress Notes (Signed)
        Established patient visit  History, exam, impression, and plan:  1. Rash Pleasant 52 year old male presenting today with complaints of a very pruritic rash along the right ankle and back of the calf.  He was placed in a walking boot approximately 3 weeks ago and has been wearing this as instructed but has not been wearing a sock.  Reports the rash is along the right lateral ankle and extending around to the Achilles area.  On exam, he does have a slightly raised patchy rash with no pustules or vesicles noted.  No significant excoriation.  No open wounds or drainage.  He has tried apple cider vinegar, topical Benadryl, and Lotrimin without relief of symptoms.  Start Lotrisone to the affected area twice daily for up to 14 days.  Recommend wearing socks whenever wearing the boot but okay to leave open to air at night.  If the boot is rubbing on the affected area causing worsening itching or irritation, okay to place a nonstick gauze over the rash protection.  2. Need for influenza vaccination Flu shot given in office today. - Flu vaccine trivalent PF, 6mos and older(Flulaval,Afluria,Fluarix,Fluzone)   Procedures performed this visit: None.  Return if symptoms worsen or fail to improve.  __________________________________ Thayer Ohm, DNP, APRN, FNP-BC Primary Care and Sports Medicine [facility] [place]

## 2023-03-27 DIAGNOSIS — G4733 Obstructive sleep apnea (adult) (pediatric): Secondary | ICD-10-CM | POA: Diagnosis not present

## 2023-03-29 DIAGNOSIS — S92354A Nondisplaced fracture of fifth metatarsal bone, right foot, initial encounter for closed fracture: Secondary | ICD-10-CM | POA: Diagnosis not present

## 2023-03-29 DIAGNOSIS — M79671 Pain in right foot: Secondary | ICD-10-CM | POA: Diagnosis not present

## 2023-04-09 ENCOUNTER — Other Ambulatory Visit: Payer: Self-pay | Admitting: Sports Medicine

## 2023-04-09 DIAGNOSIS — E782 Mixed hyperlipidemia: Secondary | ICD-10-CM

## 2023-04-11 ENCOUNTER — Telehealth: Payer: Self-pay

## 2023-04-11 NOTE — Telephone Encounter (Signed)
Pt called in asking if he can take ashwagandha shot

## 2023-04-12 NOTE — Telephone Encounter (Signed)
Pt called an informed that per Dr Karel Jarvis it is probably okay. I can't recommend it, and would not replace other medications with it, but as long as he tolerates it, that is fine.

## 2023-04-12 NOTE — Telephone Encounter (Signed)
Pls let him know that it is probably okay. I can't recommend it, and would not replace other medications with it, but as long as he tolerates it, that is fine. Thanks

## 2023-04-26 DIAGNOSIS — G4733 Obstructive sleep apnea (adult) (pediatric): Secondary | ICD-10-CM | POA: Diagnosis not present

## 2023-05-27 DIAGNOSIS — G4733 Obstructive sleep apnea (adult) (pediatric): Secondary | ICD-10-CM | POA: Diagnosis not present

## 2023-07-18 ENCOUNTER — Other Ambulatory Visit: Payer: Self-pay | Admitting: Neurology

## 2023-07-29 ENCOUNTER — Encounter: Payer: Self-pay | Admitting: Neurology

## 2023-07-29 ENCOUNTER — Telehealth (INDEPENDENT_AMBULATORY_CARE_PROVIDER_SITE_OTHER): Payer: Commercial Managed Care - HMO | Admitting: Neurology

## 2023-07-29 VITALS — Ht 78.0 in | Wt 295.0 lb

## 2023-07-29 DIAGNOSIS — G40009 Localization-related (focal) (partial) idiopathic epilepsy and epileptic syndromes with seizures of localized onset, not intractable, without status epilepticus: Secondary | ICD-10-CM | POA: Diagnosis not present

## 2023-07-29 DIAGNOSIS — F419 Anxiety disorder, unspecified: Secondary | ICD-10-CM

## 2023-07-29 MED ORDER — LAMOTRIGINE 200 MG PO TABS: 200.0000 mg | ORAL_TABLET | Freq: Two times a day (BID) | ORAL | 4 refills | Status: AC

## 2023-07-29 MED ORDER — SERTRALINE HCL 100 MG PO TABS: ORAL_TABLET | ORAL | 4 refills | Status: AC

## 2023-07-29 MED ORDER — TOPIRAMATE ER 200 MG PO SPRINKLE CAP24: 200.0000 mg | EXTENDED_RELEASE_CAPSULE | Freq: Every morning | ORAL | 4 refills | Status: AC

## 2023-07-29 NOTE — Progress Notes (Signed)
Virtual Visit via Video Note The purpose of this virtual visit is to provide medical care while limiting exposure to the novel coronavirus.    Consent was obtained for video visit:  Yes.   Answered questions that patient had about telehealth interaction:  Yes.    Pt location: Home Physician Location: office Name of referring provider:  Monica Becton,* I connected with Jay Cook at patients initiation/request on 07/29/2023 at  3:00 PM EST by video enabled telemedicine application and verified that I am speaking with the correct person using two identifiers. Pt MRN:  [id number] Pt DOB:  April 16, 1971 Video Participants:  Jay Cook   History of Present Illness:  The patient had a virtual video visit on 07/29/2023. 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 He continues to do well seizure-free since 05/2019 on Qudexy 200mg  daily and Lamotrigine 200mg  BID, no side effects. He has not needed prn Valtoco. He denies any staring/unresponsive episodes, gaps in time, olfactory/gustatory hallucinations, focal numbness/tingling/weakness, myoclonic jerks. No headaches, dizziness, vision changes, no falls. He gets 7 hours of sleep. Mood is good, he is on Sertraline 100mg  daily. Memory is alright, Lillia Abed helps remind him of medications. He denies getting lost driving.    History On Initial Assessment 02/24/2019: This is a 53 year old right-handed man with a history of diet-controlled hyperlipidemia, left Bell's palsy with no residual deficits, presenting for second opinion regarding seizures. The first seizure occurred early morning 05/04/2018, he states it was in sleep, however ER notes indicate his significant other witnessed him come out of the bathroom, sit on the bed, they lay down, then almost immediately make a grunting sound, both arms stiff and held above his hed, followed by convulsive activity causing him to fall off the bed. Seizure lasted less than  2 minutes, he was very confused and slightly combative after. In the ER, head CT without contrast was unremarkable, bloodwork overall unremarkable except for slightly elevated creatinine 1.7, UDS negative. He was evaluated by neurologist Dr. Estella Husk a few days later, per notes MRI brain normal, EEG abnormal showing right temporal sharp and slow wave and mild diffuse slowing. He was started on Trokendi. On 10/15/2018 he woke up with slurred speech, fatigue, dry mouth, and back pain. He went to the ER 3 days later for increased delusional thought, memory deficits, decreased sleep, and increasing agitation. He was also reporting tingling in his tongue and legs. Bloodwork unremarkable. Symptoms thought secondary to Trokendi, he reports being very stressed that time undergoing divorce proceedings. He was started on Sertraline and prn Xanax. He had an episode on 11/29/2018 where he was shaking and zoned out, unresponsive with diaphoresis, 911 was called but he did not go to the ER. He continued to report numbness and tingling in his tongue after the seizure. Repeat EEG showed right temporal slowing and sharp waves. Trokendi XR dose increased to 200mg  daily. He was back in the ER on 02/01/2019 for a nocturnal seizure, his 53 year old heard a noise and found him unresponsive and thrashing in bed, incontinent of urine with tongue bite. Lamotrigine was added, he is currently on an uptitration schedule increasing to 50mg  BID today.   He denies any staring/unresponsive episodes, gaps in time, olfactory/gustatory hallucinations, deja vu, rising epigastric sensation, myoclonic jerks. His right arm feels numb every once in a while. He has also noticed occasional mild tremor in his right hand. He denies any headaches, dizziness, vision changes,  bowel/bladder dysfunction. He lives with his children half the time with his ex-wife, works as a Production manager for [employer]. He snores and feels tired on awakening, Dr. Estella Husk has ordered a  sleep study. He has taken Xanax only 3 or 4 times since April. He occasional drinks alcohol on the weekends and may have had a drink or 2 prior to one of the seizures. He has a history of left Bell's palsy 8-10 years ago which resolved after a month. He states his memory is okay, "I'm just getting old," he denies missing medications or bills, or getting lost driving (discussed that he should not be driving). Mood is pretty good.   Epilepsy Risk Factors:  He had a normal birth and early development.  There is no history of febrile convulsions, CNS infections such as meningitis/encephalitis, significant traumatic brain injury, neurosurgical procedures, or family history of seizures.  EEGs: MRI: I personally reviewed MRI Brain without contrast done 10/18/2018 which did not show any acute changes, hippocampi symmetric with no abnormal signal. 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  EEG done at Dr. Alesia Richards office in 05/2018 and 12/2018 showed right temporal slowing and sharp waves. EEG in 03/2019 was normal.   Current Outpatient Medications on File Prior to Visit  Medication Sig Dispense Refill   AMBULATORY NON FORMULARY MEDICATION Continuous positive airway pressure (CPAP) machine set on AutoPAP (4-20 cmH2O), with all supplemental supplies as needed. 1 each 0   diazePAM (VALTOCO 10 MG DOSE) 10 MG/0.1ML LIQD Place 1 Dose into the nose as needed (for focal seizure lasting longer than 15 mins). Give 1 spray in each nostril (use one device per nostril). May give another dose at least 4 hours after first dose. 10 each 5   fenofibrate 160 MG tablet Take 1 tablet (160 mg total) by mouth daily. 90 tablet 3   lamoTRIgine (LAMICTAL) 200 MG tablet Take 1 tablet (200 mg total) by mouth 2 (two) times daily. 180 tablet 3   pravastatin (PRAVACHOL) 20 MG tablet TAKE 1 TABLET(20 MG) BY MOUTH DAILY 90 tablet 3    sertraline (ZOLOFT) 100 MG tablet Take 1 tablet every night 90 tablet 3   testosterone enanthate (DELATESTRYL) 200 MG/ML injection Inject 100 mg into the muscle every 14 (fourteen) days. For IM use only     doxycycline (VIBRAMYCIN) 50 MG capsule Take 50 mg by mouth 3 (three) times daily. (Patient not taking: Reported on 07/29/2023)     topiramate ER (QUDEXY XR) 200 MG CS24 sprinkle capsule TAKE 1 CAPSULE BY MOUTH EVERY MORNING 30 capsule 0   No current facility-administered medications on file prior to visit.     Observations/Objective:   Vitals:   07/29/23 1433  Weight: 295 lb (133.8 kg)  Height: 6\' 6"  (1.981 m)   GEN:  The patient appears stated age and is in NAD.  Neurological examination: Patient is awake, alert. No aphasia or dysarthria. Intact fluency and comprehension. Cranial nerves: Extraocular movements intact. No facial asymmetry. Motor: moves all extremities symmetrically, at least anti-gravity x 4.    Assessment and Plan:   This is a 53 yo RH man with a history of diet-controlled hyperlipidemia, with right temporal lobe epilepsy. He has been seizure-free since 05/2019 on Lamotrigine 100mg  BID and Qudexy 200mg  daily, refills sent. Mood is good on Sertraline 100mg  daily. He has prn Valtoco for seizure rescue. He is aware of [place] driving laws to  stop driving after a seizure until 6 months seizure-free. Follow-up in 1 year, call for any changes.    Follow Up Instructions:   -I discussed the assessment and treatment plan with the patient. The patient was provided an opportunity to ask questions and all were answered. The patient agreed with the plan and demonstrated an understanding of the instructions.   The patient was advised to call back or seek an in-person evaluation if the symptoms worsen or if the condition fails to improve as anticipated.     Van Clines, MD

## 2023-07-29 NOTE — Patient Instructions (Signed)
Good to see you doing well! Continue all your medications, refills sent. Follow-up in 1 year, call for any changes.    Seizure Precautions: 1. If medication has been prescribed for you to prevent seizures, take it exactly as directed.  Do not stop taking the medicine without talking to your doctor first, even if you have not had a seizure in a long time.   2. Avoid activities in which a seizure would cause danger to yourself or to others.  Don't operate dangerous machinery, swim alone, or climb in high or dangerous places, such as on ladders, roofs, or girders.  Do not drive unless your doctor says you may.  3. If you have any warning that you may have a seizure, lay down in a safe place where you can't hurt yourself.    4.  No driving for 6 months from last seizure, as per [facility].   Please refer to the following link on the [employer] of [employer]'s website for more information: http://www.epilepsyfoundation.org/answerplace/Social/driving/drivingu.cfm   5.  Maintain good sleep hygiene. Avoid alcohol.  6.  Contact your doctor if you have any problems that may be related to the medicine you are taking.  7.  Call 911 and bring the patient back to the ED if:        A.  The seizure lasts longer than 5 minutes.       B.  The patient doesn't awaken shortly after the seizure  C.  The patient has new problems such as difficulty seeing, speaking or moving  D.  The patient was injured during the seizure  E.  The patient has a temperature over 102 F (39C)  F.  The patient vomited and now is having trouble breathing

## 2023-10-03 DIAGNOSIS — G4733 Obstructive sleep apnea (adult) (pediatric): Secondary | ICD-10-CM | POA: Diagnosis not present

## 2023-11-02 DIAGNOSIS — G4733 Obstructive sleep apnea (adult) (pediatric): Secondary | ICD-10-CM | POA: Diagnosis not present

## 2023-12-03 DIAGNOSIS — G4733 Obstructive sleep apnea (adult) (pediatric): Secondary | ICD-10-CM | POA: Diagnosis not present

## 2023-12-05 DIAGNOSIS — E349 Endocrine disorder, unspecified: Secondary | ICD-10-CM | POA: Diagnosis not present

## 2023-12-23 DIAGNOSIS — E291 Testicular hypofunction: Secondary | ICD-10-CM | POA: Diagnosis not present

## 2023-12-23 DIAGNOSIS — Z125 Encounter for screening for malignant neoplasm of prostate: Secondary | ICD-10-CM | POA: Diagnosis not present

## 2024-02-18 DIAGNOSIS — G4733 Obstructive sleep apnea (adult) (pediatric): Secondary | ICD-10-CM | POA: Diagnosis not present

## 2024-03-03 ENCOUNTER — Encounter: Payer: Self-pay | Admitting: Sports Medicine

## 2024-04-29 ENCOUNTER — Telehealth: Payer: Self-pay | Admitting: Sports Medicine

## 2024-04-29 ENCOUNTER — Other Ambulatory Visit: Payer: Self-pay

## 2024-04-29 DIAGNOSIS — E782 Mixed hyperlipidemia: Secondary | ICD-10-CM

## 2024-04-29 MED ORDER — PRAVASTATIN SODIUM 20 MG PO TABS: 20.0000 mg | ORAL_TABLET | Freq: Every day | ORAL | 0 refills | Status: AC

## 2024-04-29 NOTE — Telephone Encounter (Signed)
 LVM Given 30 day refill on his Pravastatin . Call to make appointment with new provider.

## 2024-05-12 ENCOUNTER — Encounter: Payer: Self-pay | Admitting: Neurology

## 2024-05-20 ENCOUNTER — Telehealth: Payer: Self-pay | Admitting: Sports Medicine

## 2024-05-20 NOTE — Telephone Encounter (Signed)
 Called the pt to schedule a TOC/Med refill appointment. LVM to call back

## 2024-05-25 DIAGNOSIS — G4733 Obstructive sleep apnea (adult) (pediatric): Secondary | ICD-10-CM | POA: Diagnosis not present

## 2024-07-20 NOTE — Progress Notes (Unsigned)
 "  NEUROLOGY FOLLOW UP OFFICE NOTE  Infant Jay Cook [id number] [birth year]  HISTORY OF PRESENT ILLNESS: I had the pleasure of seeing Jay Cook in follow-up in the neurology clinic on 07/21/2024.  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 He is and is accompanied by *** today.  Records and images were personally reviewed where available.  ***.  The patient had a virtual video visit on 07/29/2023. 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 He continues to do well seizure-free since 05/2019 on Qudexy  200mg  daily and Lamotrigine  200mg  BID, no side effects. He has not needed prn Valtoco . He denies any staring/unresponsive episodes, gaps in time, olfactory/gustatory hallucinations, focal numbness/tingling/weakness, myoclonic jerks. No headaches, dizziness, vision changes, no falls. He gets 7 hours of sleep. Mood is good, he is on Sertraline  100mg  daily. Memory is alright, Manuelita helps remind him of medications. He denies getting lost driving.    History On Initial Assessment 02/24/2019: This is a 54 year old right-handed man with a history of diet-controlled hyperlipidemia, left Bell's palsy with no residual deficits, presenting for second opinion regarding seizures. The first seizure occurred early morning 05/04/2018, he states it was in sleep, however ER notes indicate his significant other witnessed him come out of the bathroom, sit on the bed, they lay down, then almost immediately make a grunting sound, both arms stiff and held above his hed, followed by convulsive activity causing him to fall off the bed. Seizure lasted less than 2 minutes, he was very confused and slightly combative after. In the ER, head CT without contrast was unremarkable, bloodwork overall unremarkable except for slightly elevated creatinine 1.7, UDS negative. He was evaluated by neurologist Dr. Euna a few days later, per notes MRI brain normal, EEG abnormal showing  right temporal sharp and slow wave and mild diffuse slowing. He was started on Trokendi . On 10/15/2018 he woke up with slurred speech, fatigue, dry mouth, and back pain. He went to the ER 3 days later for increased delusional thought, memory deficits, decreased sleep, and increasing agitation. He was also reporting tingling in his tongue and legs. Bloodwork unremarkable. Symptoms thought secondary to Trokendi , he reports being very stressed that time undergoing divorce proceedings. He was started on Sertraline  and prn Xanax. He had an episode on 11/29/2018 where he was shaking and zoned out, unresponsive with diaphoresis, 911 was called but he did not go to the ER. He continued to report numbness and tingling in his tongue after the seizure. Repeat EEG showed right temporal slowing and sharp waves. Trokendi  XR dose increased to 200mg  daily. He was back in the ER on 02/01/2019 for a nocturnal seizure, his 53 year old heard a noise and found him unresponsive and thrashing in bed, incontinent of urine with tongue bite. Lamotrigine  was added, he is currently on an uptitration schedule increasing to 50mg  BID today.   He denies any staring/unresponsive episodes, gaps in time, olfactory/gustatory hallucinations, deja vu, rising epigastric sensation, myoclonic jerks. His right arm feels numb every once in a while. He has also noticed occasional mild tremor in his right hand. He denies any headaches, dizziness, vision changes, bowel/bladder dysfunction. He lives with his children half the time with his ex-wife, works as a production manager for [employer]. He snores and feels tired on awakening, Dr. Euna has ordered a sleep study. He has taken Xanax only 3 or 4 times since April. He occasional drinks alcohol  on the weekends and may have had a drink or 2 prior to one of the seizures. He has a history of left Bell's palsy 8-10 years ago which resolved after a month. He states his memory is okay, I'm just getting old, he denies  missing medications or bills, or getting lost driving (discussed that he should not be driving). Mood is pretty good.   Epilepsy Risk Factors:  He had a normal birth and early development.  There is no history of febrile convulsions, CNS infections such as meningitis/encephalitis, significant traumatic brain injury, neurosurgical procedures, or family history of seizures.  EEGs: MRI: I personally reviewed MRI Brain without contrast done 10/18/2018 which did not show any acute changes, hippocampi symmetric with no abnormal signal. 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  EEG done at Dr. Lorita office in 05/2018 and 12/2018 showed right temporal slowing and sharp waves. EEG in 03/2019 was normal.  PAST MEDICAL HISTORY: Past Medical History:  Diagnosis Date   Bell's palsy    Mixed hyperlipidemia 06/28/2017   Seizures (HCC) 05/2018   had 1 grand-mal seizure, placed on Trokendi  XR    MEDICATIONS: Medications Ordered Prior to Encounter[1]  ALLERGIES: Allergies[2]  FAMILY HISTORY: Family History  Problem Relation Age of Onset   Hyperlipidemia Father    Diabetes Neg Hx    Heart attack Neg Hx    Hypertension Neg Hx    Sudden death Neg Hx     SOCIAL HISTORY: Social History   Socioeconomic History   Marital status: Married    Spouse name: Not on file   Number of children: 2   Years of education: Not on file   Highest education level: Not on file  Occupational History   Not on file  Tobacco Use   Smoking status: Never   Smokeless tobacco: Never  Vaping Use   Vaping status: Never Used  Substance and Sexual Activity   Alcohol use: Yes    Alcohol/week: 12.0 standard drinks of alcohol    Types: 12 Cans of beer per week   Drug use: Never   Sexual activity: Not on file  Other Topics Concern   Not on file  Social History Narrative   Right handed      Highest level of  edu- colleg      Lives alone- kids live with him 50% of the time due to separation   Caffiene tea every other day energy drinks every other day   Social Drivers of Health   Tobacco Use: Low Risk (07/29/2023)   Patient History    Smoking Tobacco Use: Never    Smokeless Tobacco Use: Never    Passive Exposure: Not on file  Financial Resource Strain: Not on file  Food Insecurity: Not on file  Transportation Needs: Not on file  Physical Activity: Not on file  Stress: Not on file  Social Connections: Not on file  Intimate Partner Violence: Not on file  Depression (PHQ2-9): Low Risk (08/17/2022)   Depression (PHQ2-9)    PHQ-2 Score: 0  Alcohol Screen: Not on file  Housing: Not on file  Utilities: Not on file  Health Literacy: Not on file     PHYSICAL EXAM: There were no vitals filed for this visit. General: No acute distress Head:  Normocephalic/atraumatic Skin/Extremities: No rash, no edema Neurological Exam: alert and oriented to person, place, and time. No aphasia or dysarthria. Fund of knowledge is appropriate.  Recent and remote memory are intact.  Attention and concentration are normal.   Cranial nerves: Pupils equal, round. Extraocular movements intact with no nystagmus. Visual fields full.  No facial asymmetry.  Motor: Bulk and tone normal, muscle strength 5/5 throughout with no pronator drift.   Finger to nose testing intact.  Gait narrow-based and steady, able to tandem walk adequately.  Romberg negative.   IMPRESSION: This is a 54 yo RH man with a history of diet-controlled hyperlipidemia, with right temporal lobe epilepsy. He has been seizure-free since 05/2019 on Lamotrigine  100mg  BID and Qudexy  200mg  daily, refills sent. Mood is good on Sertraline  100mg  daily. He has prn Valtoco  for seizure rescue. He is aware of [place] driving laws to stop driving after a seizure until 6 months seizure-free. Follow-up in 1 year, call for any changes.     Thank you for allowing me to  participate in *** care.  Please do not hesitate to call for any questions or concerns.  The duration of this appointment visit was *** minutes of face-to-face time with the patient.  Greater than 50% of this time was spent in counseling, explanation of diagnosis, planning of further management, and coordination of care.   Darice Shivers, M.D.   CC: ***       [1]  Current Outpatient Medications on File Prior to Visit  Medication Sig Dispense Refill   AMBULATORY NON FORMULARY MEDICATION Continuous positive airway pressure (CPAP) machine set on AutoPAP (4-20 cmH2O), with all supplemental supplies as needed. 1 each 0   diazePAM  (VALTOCO  10 MG DOSE) 10 MG/0.1ML LIQD Place 1 Dose into the nose as needed (for focal seizure lasting longer than 15 mins). Give 1 spray in each nostril (use one device per nostril). May give another dose at least 4 hours after first dose. 10 each 5   doxycycline (VIBRAMYCIN) 50 MG capsule Take 50 mg by mouth 3 (three) times daily. (Patient not taking: Reported on 07/29/2023)     fenofibrate  160 MG tablet Take 1 tablet (160 mg total) by mouth daily. 90 tablet 3   lamoTRIgine  (LAMICTAL ) 200 MG tablet Take 1 tablet (200 mg total) by mouth 2 (two) times daily. 180 tablet 4   pravastatin  (PRAVACHOL ) 20 MG tablet Take 1 tablet (20 mg total) by mouth daily. NEED APPT WITH NEW PROVIDER 30 tablet 0   sertraline  (ZOLOFT ) 100 MG tablet Take 1 tablet every night 90 tablet 4   testosterone  enanthate (DELATESTRYL) 200 MG/ML injection Inject 100 mg into the muscle every 14 (fourteen) days. For IM use only     topiramate  ER (QUDEXY  XR) 200 MG CS24 sprinkle capsule Take 1 capsule (200 mg total) by mouth every morning. 90 capsule 4   No current facility-administered medications on file prior to visit.  [2]  Allergies Allergen Reactions   Topiramate  Rash   "

## 2024-07-21 ENCOUNTER — Ambulatory Visit: Payer: BC Managed Care – PPO | Admitting: Neurology

## 2024-07-21 ENCOUNTER — Encounter: Payer: Self-pay | Admitting: Neurology

## 2024-09-08 ENCOUNTER — Ambulatory Visit: Payer: Self-pay | Admitting: Neurology
